# Patient Record
Sex: Male | Born: 2000 | Race: White | Hispanic: No | Marital: Single | Smoking: Never smoker
Health system: Southern US, Community
[De-identification: ages and names within clinical notes are randomized; demographics above are authoritative.]

## PROBLEM LIST (undated history)

## (undated) DIAGNOSIS — L709 Acne, unspecified: Secondary | ICD-10-CM

## (undated) DIAGNOSIS — F909 Attention-deficit hyperactivity disorder, unspecified type: Secondary | ICD-10-CM

## (undated) DIAGNOSIS — F32A Depression, unspecified: Secondary | ICD-10-CM

## (undated) DIAGNOSIS — F329 Major depressive disorder, single episode, unspecified: Secondary | ICD-10-CM

## (undated) DIAGNOSIS — F419 Anxiety disorder, unspecified: Secondary | ICD-10-CM

## (undated) HISTORY — DX: Depression, unspecified: F32.A

## (undated) HISTORY — DX: Anxiety disorder, unspecified: F41.9

## (undated) HISTORY — DX: Acne, unspecified: L70.9

## (undated) HISTORY — DX: Attention-deficit hyperactivity disorder, unspecified type: F90.9

## (undated) HISTORY — DX: Major depressive disorder, single episode, unspecified: F32.9

---
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
## 2019-05-11 ENCOUNTER — Emergency Department (HOSPITAL_COMMUNITY): Payer: Medicaid Other

## 2019-05-11 ENCOUNTER — Other Ambulatory Visit: Payer: Self-pay

## 2019-05-11 ENCOUNTER — Encounter (HOSPITAL_COMMUNITY): Payer: Self-pay | Admitting: Emergency Medicine

## 2019-05-11 ENCOUNTER — Inpatient Hospital Stay (HOSPITAL_COMMUNITY)
Admission: EM | Admit: 2019-05-11 | Discharge: 2019-05-13 | DRG: 872 | Disposition: A | Discharge status: Home / Self Care | Payer: Medicaid Other | Attending: Internal Medicine | Admitting: Internal Medicine

## 2019-05-11 DIAGNOSIS — R112 Nausea with vomiting, unspecified: Secondary | ICD-10-CM

## 2019-05-11 DIAGNOSIS — R197 Diarrhea, unspecified: Secondary | ICD-10-CM

## 2019-05-11 DIAGNOSIS — F329 Major depressive disorder, single episode, unspecified: Secondary | ICD-10-CM | POA: Diagnosis present

## 2019-05-11 DIAGNOSIS — Z8249 Family history of ischemic heart disease and other diseases of the circulatory system: Secondary | ICD-10-CM

## 2019-05-11 DIAGNOSIS — E860 Dehydration: Secondary | ICD-10-CM | POA: Diagnosis not present

## 2019-05-11 DIAGNOSIS — Z8349 Family history of other endocrine, nutritional and metabolic diseases: Secondary | ICD-10-CM

## 2019-05-11 DIAGNOSIS — Z20822 Contact with and (suspected) exposure to covid-19: Secondary | ICD-10-CM | POA: Diagnosis present

## 2019-05-11 DIAGNOSIS — R000 Tachycardia, unspecified: Secondary | ICD-10-CM | POA: Diagnosis not present

## 2019-05-11 DIAGNOSIS — Z9114 Patient's other noncompliance with medication regimen: Secondary | ICD-10-CM | POA: Diagnosis not present

## 2019-05-11 DIAGNOSIS — R1110 Vomiting, unspecified: Secondary | ICD-10-CM | POA: Diagnosis not present

## 2019-05-11 DIAGNOSIS — A0811 Acute gastroenteropathy due to Norwalk agent: Secondary | ICD-10-CM | POA: Diagnosis present

## 2019-05-11 DIAGNOSIS — R509 Fever, unspecified: Secondary | ICD-10-CM | POA: Diagnosis not present

## 2019-05-11 DIAGNOSIS — F411 Generalized anxiety disorder: Secondary | ICD-10-CM | POA: Diagnosis present

## 2019-05-11 DIAGNOSIS — F959 Tic disorder, unspecified: Secondary | ICD-10-CM | POA: Diagnosis present

## 2019-05-11 DIAGNOSIS — E876 Hypokalemia: Secondary | ICD-10-CM | POA: Diagnosis present

## 2019-05-11 DIAGNOSIS — R6510 Systemic inflammatory response syndrome (SIRS) of non-infectious origin without acute organ dysfunction: Secondary | ICD-10-CM | POA: Diagnosis not present

## 2019-05-11 DIAGNOSIS — Z6841 Body Mass Index (BMI) 40.0 and over, adult: Secondary | ICD-10-CM

## 2019-05-11 DIAGNOSIS — Z833 Family history of diabetes mellitus: Secondary | ICD-10-CM | POA: Diagnosis not present

## 2019-05-11 DIAGNOSIS — I10 Essential (primary) hypertension: Secondary | ICD-10-CM | POA: Diagnosis present

## 2019-05-11 DIAGNOSIS — F909 Attention-deficit hyperactivity disorder, unspecified type: Secondary | ICD-10-CM | POA: Diagnosis present

## 2019-05-11 DIAGNOSIS — Z7722 Contact with and (suspected) exposure to environmental tobacco smoke (acute) (chronic): Secondary | ICD-10-CM | POA: Diagnosis present

## 2019-05-11 DIAGNOSIS — A419 Sepsis, unspecified organism: Principal | ICD-10-CM | POA: Diagnosis present

## 2019-05-11 LAB — CBC WITH DIFFERENTIAL/PLATELET
Abs Immature Granulocytes: 0.04 10*3/uL (ref 0.00–0.07)
Basophils Absolute: 0 10*3/uL (ref 0.0–0.1)
Basophils Relative: 0 %
Eosinophils Absolute: 0 10*3/uL (ref 0.0–0.5)
Eosinophils Relative: 0 %
HCT: 47.7 % (ref 39.0–52.0)
Hemoglobin: 15.7 g/dL (ref 13.0–17.0)
Immature Granulocytes: 0 %
Lymphocytes Relative: 6 %
Lymphs Abs: 0.6 10*3/uL — ABNORMAL LOW (ref 0.7–4.0)
MCH: 29.5 pg (ref 26.0–34.0)
MCHC: 32.9 g/dL (ref 30.0–36.0)
MCV: 89.5 fL (ref 80.0–100.0)
Monocytes Absolute: 0.5 10*3/uL (ref 0.1–1.0)
Monocytes Relative: 5 %
Neutro Abs: 9.4 10*3/uL — ABNORMAL HIGH (ref 1.7–7.7)
Neutrophils Relative %: 89 %
Platelets: 220 10*3/uL (ref 150–400)
RBC: 5.33 MIL/uL (ref 4.22–5.81)
RDW: 13.3 % (ref 11.5–15.5)
WBC: 10.6 10*3/uL — ABNORMAL HIGH (ref 4.0–10.5)
nRBC: 0 % (ref 0.0–0.2)

## 2019-05-11 LAB — COMPREHENSIVE METABOLIC PANEL
ALT: 31 U/L (ref 0–44)
AST: 24 U/L (ref 15–41)
Albumin: 4.2 g/dL (ref 3.5–5.0)
Alkaline Phosphatase: 60 U/L (ref 38–126)
Anion gap: 12 (ref 5–15)
BUN: 14 mg/dL (ref 6–20)
CO2: 23 mmol/L (ref 22–32)
Calcium: 8.7 mg/dL — ABNORMAL LOW (ref 8.9–10.3)
Chloride: 100 mmol/L (ref 98–111)
Creatinine, Ser: 0.82 mg/dL (ref 0.61–1.24)
GFR calc Af Amer: >60 mL/min (ref >60)
GFR calc non Af Amer: >60 mL/min (ref >60)
Glucose, Bld: 111 mg/dL — ABNORMAL HIGH (ref 70–99)
Potassium: 3.2 mmol/L — ABNORMAL LOW (ref 3.5–5.1)
Sodium: 135 mmol/L (ref 135–145)
Total Bilirubin: 1.6 mg/dL — ABNORMAL HIGH (ref 0.3–1.2)
Total Protein: 7.6 g/dL (ref 6.5–8.1)

## 2019-05-11 LAB — LIPASE, BLOOD: Lipase: 19 U/L (ref 11–51)

## 2019-05-11 LAB — LACTIC ACID, PLASMA: Lactic Acid, Venous: 1.8 mmol/L (ref 0.5–1.9)

## 2019-05-11 LAB — POC SARS CORONAVIRUS 2 AG -  ED: SARS Coronavirus 2 Ag: NEGATIVE

## 2019-05-11 LAB — MAGNESIUM: Magnesium: 1.6 mg/dL — ABNORMAL LOW (ref 1.7–2.4)

## 2019-05-11 MED ORDER — METRONIDAZOLE IN NACL 5-0.79 MG/ML-% IV SOLN
500.0000 mg | Freq: Three times a day (TID) | INTRAVENOUS | Status: DC
  Administered 2019-05-12 – 2019-05-13 (×5): 500 mg via INTRAVENOUS
  Filled 2019-05-11 (×6): qty 100

## 2019-05-11 MED ORDER — ONDANSETRON HCL 4 MG/2ML IJ SOLN: 4.0000 mg | Freq: Four times a day (QID) | INTRAMUSCULAR | Status: DC | PRN

## 2019-05-11 MED ORDER — ENOXAPARIN SODIUM 40 MG/0.4ML ~~LOC~~ SOLN
40.0000 mg | Freq: Every day | SUBCUTANEOUS | Status: DC
  Administered 2019-05-11 – 2019-05-12 (×2): 40 mg via SUBCUTANEOUS
  Filled 2019-05-11 (×2): qty 0.4

## 2019-05-11 MED ORDER — ONDANSETRON HCL 4 MG/2ML IJ SOLN
4.0000 mg | Freq: Once | INTRAMUSCULAR | Status: AC
  Administered 2019-05-11: 4 mg via INTRAVENOUS
  Filled 2019-05-11: qty 2

## 2019-05-11 MED ORDER — METRONIDAZOLE IN NACL 5-0.79 MG/ML-% IV SOLN
500.0000 mg | Freq: Once | INTRAVENOUS | Status: AC
  Administered 2019-05-11: 500 mg via INTRAVENOUS
  Filled 2019-05-11: qty 100

## 2019-05-11 MED ORDER — IOHEXOL 300 MG/ML  SOLN
100.0000 mL | Freq: Once | INTRAMUSCULAR | Status: AC | PRN
  Administered 2019-05-11: 100 mL via INTRAVENOUS

## 2019-05-11 MED ORDER — SODIUM CHLORIDE 0.9 % IV SOLN: INTRAVENOUS | Status: DC

## 2019-05-11 MED ORDER — ACETAMINOPHEN 325 MG PO TABS
650.0000 mg | ORAL_TABLET | Freq: Once | ORAL | Status: AC
  Administered 2019-05-11: 650 mg via ORAL
  Filled 2019-05-11: qty 2

## 2019-05-11 MED ORDER — SODIUM CHLORIDE 0.9 % IV BOLUS
500.0000 mL | Freq: Once | INTRAVENOUS | Status: AC
  Administered 2019-05-11: 500 mL via INTRAVENOUS

## 2019-05-11 MED ORDER — ONDANSETRON HCL 4 MG PO TABS: 4.0000 mg | ORAL_TABLET | Freq: Four times a day (QID) | ORAL | Status: DC | PRN

## 2019-05-11 MED ORDER — ACETAMINOPHEN 325 MG PO TABS
650.0000 mg | ORAL_TABLET | Freq: Four times a day (QID) | ORAL | Status: DC | PRN
  Administered 2019-05-12: 650 mg via ORAL
  Filled 2019-05-11: qty 2

## 2019-05-11 MED ORDER — LACTATED RINGERS IV BOLUS (SEPSIS)
1000.0000 mL | Freq: Once | INTRAVENOUS | Status: AC
  Administered 2019-05-11: 1000 mL via INTRAVENOUS

## 2019-05-11 MED ORDER — SODIUM CHLORIDE 0.9 % IV SOLN
2.0000 g | Freq: Once | INTRAVENOUS | Status: AC
  Administered 2019-05-11: 2 g via INTRAVENOUS
  Filled 2019-05-11: qty 20

## 2019-05-11 MED ORDER — POTASSIUM CHLORIDE CRYS ER 20 MEQ PO TBCR
40.0000 meq | EXTENDED_RELEASE_TABLET | Freq: Once | ORAL | Status: AC
  Administered 2019-05-11: 40 meq via ORAL
  Filled 2019-05-11: qty 4

## 2019-05-11 MED ORDER — POTASSIUM CHLORIDE CRYS ER 20 MEQ PO TBCR
40.0000 meq | EXTENDED_RELEASE_TABLET | Freq: Once | ORAL | Status: AC
  Administered 2019-05-11: 40 meq via ORAL
  Filled 2019-05-11: qty 2

## 2019-05-11 MED ORDER — SODIUM CHLORIDE 0.9 % IV SOLN
2.0000 g | INTRAVENOUS | Status: DC
  Administered 2019-05-12: 2 g via INTRAVENOUS
  Filled 2019-05-11: qty 20

## 2019-05-11 MED ORDER — ACETAMINOPHEN 650 MG RE SUPP: 650.0000 mg | Freq: Four times a day (QID) | RECTAL | Status: DC | PRN

## 2019-05-11 NOTE — ED Triage Notes (Signed)
Ate at [employer] yesterday - chicken nuggets cheese burger fries  Vomiting since then   Vomited x 3 since this am   abd pain 7/10   Here for eval of "food poisoning"

## 2019-05-11 NOTE — ED Provider Notes (Signed)
[facility] EMERGENCY DEPARTMENT Provider Note   CSN: [id number] Arrival date & time: 05/11/19  1626     History Chief Complaint  Patient presents with  . Emesis    Henry [name] is a 19 y.o. male with past medical history of ADHD, depression, morbid obesity, motor tic disorder, generalized anxiety, who presents today for evaluation of multiple complaints. He reports that yesterday afternoon he ate [employer] and then had about 7 PM he developed abdominal pain, nausea, vomiting, and diarrhea.  Since this started he has had 7-8 episodes of vomiting and diarrhea each.  He thinks he may have had a fever at home however is unsure.  No known Covid contacts.  He reports that his sense of smell is still intact.  He reports that he feels lightheaded like he is going to pass out when he stands up.  He denies dysuria.  He denies any prior abdominal surgeries.  HPI     Past Medical History:  Diagnosis Date  . Acne   . ADHD (attention deficit hyperactivity disorder)   . Anxiety   . Depression     Patient Active Problem List   Diagnosis Date Noted  . Generalized anxiety disorder 08/26/2018  . Tremor of right hand 12/10/2017  . ADHD (attention deficit hyperactivity disorder) evaluation 12/10/2017  . Seizure-like activity (HCC) 11/26/2017  . Depression, recurrent (HCC) 03/21/2017  . 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 Morbid obesity (HCC) 03/22/2015  . Anxiety and depression 02/24/2015  . 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 No pertinent surgical history.     Family History  Problem Relation Age of Onset  . Thyroid disease Mother   . Diabetes Other   . Heart disease Other     Social History   Tobacco Use  . Smoking status: Passive Smoke Exposure - Never Smoker  . Smokeless tobacco: Never Used  Substance Use Topics  . Alcohol use: No    Alcohol/week: 0.0 standard drinks  . Drug use: No    Home Medications Prior to Admission medications   Medication Sig  Start Date End Date Taking? Authorizing Provider  lisdexamfetamine (VYVANSE) 60 MG capsule Take 1 capsule (60 mg total) by mouth daily. 08/05/18  Yes Dedlow, Ether Griffins, NP  sertraline (ZOLOFT) 50 MG tablet Take 1 tablet (50 mg total) by mouth 2 (two) times daily. 08/26/18  Yes [name], [name], NP    Allergies    Patient has no known allergies.  Review of Systems   Review of Systems  Constitutional: Positive for chills, fatigue and fever.  HENT: Negative for congestion.   Respiratory: Negative for chest tightness and shortness of breath.   Cardiovascular: Negative for chest pain.  Gastrointestinal: Positive for abdominal pain, diarrhea, nausea and vomiting. Negative for blood in stool.  Genitourinary: Negative for dysuria.  Musculoskeletal: Negative for back pain and myalgias.  Neurological: Positive for light-headedness (With position changes). Negative for weakness and headaches.  Psychiatric/Behavioral: Negative for confusion.  All other systems reviewed and are negative.   Physical Exam Updated Vital Signs BP 107/65   Pulse 98   Temp 100.3 F (37.9 C) (Oral)   Resp 15   Ht 5\' 6"  (1.676 m)   Wt (!) 141.5 kg   SpO2 99%   BMI 50.36 kg/m   Physical Exam Vitals and nursing note reviewed.  Constitutional:      Appearance: He is well-developed. He is obese. He is ill-appearing.  HENT:     Head: Normocephalic  and atraumatic.  Eyes:     Conjunctiva/sclera: Conjunctivae normal.  Cardiovascular:     Rate and Rhythm: Regular rhythm. Tachycardia present.     Heart sounds: Normal heart sounds. No murmur.  Pulmonary:     Effort: Pulmonary effort is normal. Tachypnea present. No respiratory distress.     Breath sounds: Normal breath sounds. No decreased breath sounds.  Abdominal:     Palpations: Abdomen is soft.     Tenderness: There is abdominal tenderness.     Comments: Exam limited by body habitus.  TTP in everywhere but LLQ, most tender in RLQ.   Musculoskeletal:      Cervical back: Normal range of motion and neck supple. No rigidity.     Right lower leg: No edema.     Left lower leg: No edema.  Skin:    General: Skin is warm and dry.  Neurological:     General: No focal deficit present.     Mental Status: He is alert and oriented to person, place, and time.     ED Results / Procedures / Treatments   Labs (all labs ordered are listed, but only abnormal results are displayed) Labs Reviewed  COMPREHENSIVE METABOLIC PANEL - Abnormal; Notable for the following components:      Result Value   Potassium 3.2 (*)    Glucose, Bld 111 (*)    Calcium 8.7 (*)    Total Bilirubin 1.6 (*)    All other components within normal limits  CBC WITH DIFFERENTIAL/PLATELET - Abnormal; Notable for the following components:   WBC 10.6 (*)    Neutro Abs 9.4 (*)    Lymphs Abs 0.6 (*)    All other components within normal limits  CULTURE, BLOOD (ROUTINE X 2)  CULTURE, BLOOD (ROUTINE X 2)  URINE CULTURE  GASTROINTESTINAL PANEL BY PCR, STOOL (REPLACES STOOL CULTURE)  C DIFFICILE QUICK SCREEN W PCR REFLEX  LIPASE, BLOOD  LACTIC ACID, PLASMA  LACTIC ACID, PLASMA  URINALYSIS, ROUTINE W REFLEX MICROSCOPIC  POC SARS CORONAVIRUS 2 AG -  ED    EKG EKG Interpretation  Date/Time:  Sunday May 11 2019 17:27:03 EDT Ventricular Rate:  110 PR Interval:    QRS Duration: 89 QT Interval:  291 QTC Calculation: 394 R Axis:   136 Text Interpretation: Sinus tachycardia Right axis deviation Borderline T abnormalities, inferior leads No STEMI Confirmed by Alvester Chou [phone]) on 05/11/2019 5:45:05 PM   Radiology CT ABDOMEN PELVIS W CONTRAST  Result Date: 05/11/2019 CLINICAL DATA:  Mid abdominal pain, vomiting EXAM: CT ABDOMEN AND PELVIS WITH CONTRAST TECHNIQUE: Multidetector CT imaging of the abdomen and pelvis was performed using the standard protocol following bolus administration of intravenous contrast. CONTRAST:  OMNIPAQUE IOHEXOL 300 MG/ML  SOLN COMPARISON:   06/14/2017 FINDINGS: Lower chest: Lung bases are clear. No effusions. Heart is normal size. Hepatobiliary: No focal hepatic abnormality. Gallbladder unremarkable. Pancreas: No focal abnormality or ductal dilatation. Spleen: No focal abnormality.  Normal size. Adrenals/Urinary Tract: No adrenal abnormality. No focal renal abnormality. No stones or hydronephrosis. Urinary bladder is unremarkable. Stomach/Bowel: Normal appendix. Stomach, large and small bowel grossly unremarkable. Vascular/Lymphatic: No evidence of aneurysm or adenopathy. Reproductive: No visible focal abnormality. Other: Insert up Musculoskeletal: No acute bony abnormality. IMPRESSION: Normal appendix. No acute findings in the abdomen or pelvis. Electronically Signed   By: Charlett Nose M.D.   On: 05/11/2019 19:15   DG Chest Port 1 View  Result Date: 05/11/2019 CLINICAL DATA:  fever, tachycardia, N/V/D, abd pain  EXAM: PORTABLE CHEST 1 VIEW COMPARISON:  06/14/2017 FINDINGS: The cardiomediastinal contours are normal. The lungs are clear. Pulmonary vasculature is normal. No consolidation, pleural effusion, or pneumothorax. No acute osseous abnormalities are seen. IMPRESSION: Negative AP view of the chest. Electronically Signed   By: Narda Rutherford M.D.   On: 05/11/2019 17:25    Procedures .Critical Care Performed by: Cristina Gong, PA-C Authorized by: Cristina Gong, PA-C   Critical care provider statement:    Critical care time (minutes):  45   Critical care was time spent personally by me on the following activities:  Discussions with consultants, evaluation of patient's response to treatment, examination of patient, ordering and performing treatments and interventions, ordering and review of laboratory studies, ordering and review of radiographic studies, pulse oximetry, re-evaluation of patient's condition, obtaining history from patient or surrogate and review of old charts   (including critical care  time)  Medications Ordered in ED Medications  lactated ringers bolus 1,000 mL (0 mLs Intravenous Stopped 05/11/19 2004)  ondansetron (ZOFRAN) injection 4 mg (4 mg Intravenous Given 05/11/19 1723)  acetaminophen (TYLENOL) tablet 650 mg (650 mg Oral Given 05/11/19 1731)  cefTRIAXone (ROCEPHIN) 2 g in sodium chloride 0.9 % 100 mL IVPB (0 g Intravenous Stopped 05/11/19 1845)  metroNIDAZOLE (FLAGYL) IVPB 500 mg ( Intravenous Stopped 05/11/19 1947)  lactated ringers bolus 1,000 mL (0 mLs Intravenous Stopped 05/11/19 2005)  iohexol (OMNIPAQUE) 300 MG/ML solution 100 mL (100 mLs Intravenous Contrast Given 05/11/19 1853)    ED Course  I have reviewed the triage vital signs and the nursing notes.  Pertinent labs & imaging results that were available during my care of the patient were reviewed by me and considered in my medical decision making (see chart for details).  Clinical Course as of May 11 2022  Sun May 11, 2019  [id number] 19 year old male with a history of depression and obesity presented to emergency department with nausea vomiting and diarrhea.  Reports yesterday in the evening he ate [employer] around 3 AM.  Approximately 4 hours later began having abrupt onset of nausea with vomiting and diarrhea.  He has been vomiting multiple times a day, he estimates about 5 times.  While he was bent over retching he also began having pain in his lower back, and thinks he may have pulled something there.  Denies it is been around anyone that he knows is Covid.  Does not have any respiratory symptoms.  On exam the patient is breathing heavily.  He has an obese abdomen.  Difficult to obtain a reliable abdominal exam given his girth but there is some mild diffuse tenderness, without focal tenderness at McBurney's point for me.  He has a negative Murphy sign.  He is febrile here.  Differential includes viral gastroenteritis versus bacterial colitis/infection stool vs. Appendicitis vs other intraabdominal inflammation.   Pending rapid covid testing, IV fluids, labs.  If rapid viral panel is negative we'll proceed to CT scan and initiate IV antibiotics.  He does not have a rigid or acute surgical abdomen at this time.   [MT]  1802 Covid test negative and the patient had a transient drop in his blood pressure.  We broadened to a full sepsis work-up and have ordered fluids and antibiotics.   [MT]    Clinical Course User Index [MT] Trifan, Kermit Balo, MD   MDM Rules/Calculators/A&P                     Patient is  a 19 year old man who presents today for evaluation of nausea, vomiting, diarrhea and abdominal pain.  Here he is found to be febrile, tachycardic, and tachypneic.  Based on the abrupt onset of his symptoms rapid Covid test antigen was obtained which was negative.  At that point code sepsis was called, and he is started on intra-abdominal sepsis antibiotics.  Blood cultures were obtained.  Lactic acid is not significantly elevated.  He does have a leukocytosis at 10.6.  CMP with mild elevation of total bilirubin at 1.6 and hypokalemia at 3.2.  Lipase is not elevated.  While in the emergency room patient's blood pressure did dip down into the 39Q systolic, however this improved with continued fluid administration.  He was given an adjusted 30/kg given his elevated BMI.  CT abdomen pelvis was obtained without evidence of appendicitis or other acute findings.  Given patient's fever, tachycardia and sirs criteria along with borderline hypotension will admit patient. I spoke with Dr. Arlyce Dice who will see patient.   Note: Portions of this report may have been transcribed using voice recognition software. Every effort was made to ensure accuracy; however, inadvertent computerized transcription errors may be present  Final Clinical Impression(s) / ED Diagnoses Final diagnoses:  Dehydration  Nausea vomiting and diarrhea  SIRS (systemic inflammatory response syndrome) [facility])    Rx / DC Orders ED Discharge  Orders    None       Lorin Glass, PA-C 05/12/19 0015    Wyvonnia Dusky, MD 05/12/19 [phone]

## 2019-05-11 NOTE — H&P (Signed)
History and Physical    Henry [name] VVO:[id number] DOB: [birth year] DOA: 05/11/2019  PCP: [name], Elige Radon, MD   Patient coming from: Home  I have personally briefly reviewed patient's old medical records in [facility]  Chief Complaint: Vomiting, diarrhea  HPI: Henry [name] is a 19 y.o. male with medical history significant for anxiety and depression, seizure activity, motor-tic disorder. Patient presented to the ED with complaints of vomiting and abdominal pain with diarrhea started yesterday evening about 3 hours after he ate chicken nuggets, cheeseburger and fries from [employer] yesterday. He has had up to 8 episodes of vomiting and similar episodes of loose stools since onset. Denies difficulty breathing, no cough, no pain with urination.  Denies illicit drug use, tobacco and alcohol use.  ED Course: Temperature 102.2, tachypneic, tachycardic to 143, systolic 94-124.  Potassium 3.2, sodium 135, WBC 10.6.  Normal lipase 19.  Unremarkable liver enzymes.  Blood and urine cultures obtained.  Point-of-care Covid test negative.  Chest x-ray and abdominal CT, both without acute findings. Started on IV ceftriaxone and metronidazole in the ED.  Hospitalist admit for further evaluation and management.  Review of Systems: As per HPI all other systems reviewed and negative.  Past Medical History:  Diagnosis Date  . Acne   . ADHD (attention deficit hyperactivity disorder)   . Anxiety   . Depression     History reviewed. No pertinent surgical history.   reports that he is a non-smoker but has been exposed to tobacco smoke. He has never used smokeless tobacco. He reports that he does not drink alcohol or use drugs.  No Known Allergies  Family History  Problem Relation Age of Onset  . Thyroid disease Mother   . Diabetes Other   . Heart disease Other     Prior to Admission medications   Medication Sig Start Date End Date Taking? Authorizing Provider  lisdexamfetamine  (VYVANSE) 60 MG capsule Take 1 capsule (60 mg total) by mouth daily. 08/05/18  Yes Dedlow, Ether Griffins, NP  sertraline (ZOLOFT) 50 MG tablet Take 1 tablet (50 mg total) by mouth 2 (two) times daily. 08/26/18  Yes Paretta-LeaheyMiachel Roux, NP    Physical Exam: Vitals:   05/11/19 1859 05/11/19 1900 05/11/19 1908 05/11/19 1930  BP:  (!) 98/39 107/65 107/65  Pulse:  (!) 105 (!) 109 98  Resp:  (!) 34 19 15  Temp: 100.3 F (37.9 C)     TempSrc: Oral     SpO2:  98% 99% 99%  Weight:      Height:        Constitutional: NAD, calm, comfortable Vitals:   05/11/19 1859 05/11/19 1900 05/11/19 1908 05/11/19 1930  BP:  (!) 98/39 107/65 107/65  Pulse:  (!) 105 (!) 109 98  Resp:  (!) 34 19 15  Temp: 100.3 F (37.9 C)     TempSrc: Oral     SpO2:  98% 99% 99%  Weight:      Height:       Eyes: PERRL, lids and conjunctivae normal ENMT: Mucous membranes are moist.  Neck: normal, supple, no masses, no thyromegaly Respiratory:  Normal respiratory effort. No accessory muscle use.  Cardiovascular: Regular rate and rhythm, no murmurs / rubs / gallops. No extremity edema. 2+ pedal pulses.   Abdomen: Minimal tenderness left upper abdomen, no masses palpated. No hepatosplenomegaly. Bowel sounds positive.  Musculoskeletal: no clubbing / cyanosis. No joint deformity upper and lower extremities. Good ROM, no contractures.  Skin: no  rashes, lesions, ulcers. No induration Neurologic: No apparent cranial nerve abnormality, moving all extremities spontaneously Psychiatric: Normal judgment and insight. Alert and oriented x 3. Normal mood.   Labs on Admission: I have personally reviewed following labs and imaging studies  CBC: Recent Labs  Lab 05/11/19 1714  WBC 10.6*  NEUTROABS 9.4*  HGB 15.7  HCT 47.7  MCV 89.5  PLT 160   Basic Metabolic Panel: Recent Labs  Lab 05/11/19 1714  NA 135  K 3.2*  CL 100  CO2 23  GLUCOSE 111*  BUN 14  CREATININE 0.82  CALCIUM 8.7*   Liver Function Tests: Recent Labs    Lab 05/11/19 1714  AST 24  ALT 31  ALKPHOS 60  BILITOT 1.6*  PROT 7.6  ALBUMIN 4.2   Recent Labs  Lab 05/11/19 1714  LIPASE 19    Radiological Exams on Admission: CT ABDOMEN PELVIS W CONTRAST  Result Date: 05/11/2019 CLINICAL DATA:  Mid abdominal pain, vomiting EXAM: CT ABDOMEN AND PELVIS WITH CONTRAST TECHNIQUE: Multidetector CT imaging of the abdomen and pelvis was performed using the standard protocol following bolus administration of intravenous contrast. CONTRAST:  147mL OMNIPAQUE IOHEXOL 300 MG/ML  SOLN COMPARISON:  06/14/2017 FINDINGS: Lower chest: Lung bases are clear. No effusions. Heart is normal size. Hepatobiliary: No focal hepatic abnormality. Gallbladder unremarkable. Pancreas: No focal abnormality or ductal dilatation. Spleen: No focal abnormality.  Normal size. Adrenals/Urinary Tract: No adrenal abnormality. No focal renal abnormality. No stones or hydronephrosis. Urinary bladder is unremarkable. Stomach/Bowel: Normal appendix. Stomach, large and small bowel grossly unremarkable. Vascular/Lymphatic: No evidence of aneurysm or adenopathy. Reproductive: No visible focal abnormality. Other: Insert up Musculoskeletal: No acute bony abnormality. IMPRESSION: Normal appendix. No acute findings in the abdomen or pelvis. Electronically Signed   By: Rolm Baptise M.D.   On: 05/11/2019 19:15   DG Chest Port 1 View  Result Date: 05/11/2019 CLINICAL DATA:  fever, tachycardia, N/V/D, abd pain EXAM: PORTABLE CHEST 1 VIEW COMPARISON:  06/14/2017 FINDINGS: The cardiomediastinal contours are normal. The lungs are clear. Pulmonary vasculature is normal. No consolidation, pleural effusion, or pneumothorax. No acute osseous abnormalities are seen. IMPRESSION: Negative AP view of the chest. Electronically Signed   By: Keith Rake M.D.   On: 05/11/2019 17:25    EKG: Independently reviewed.. Sinus tachycardia rate 110, QTc 394, nonspecific T wave abnormalities in leads II, III, aVF.  No old  EKG to compare.  Assessment/Plan Active Problems:   SIRS (systemic inflammatory response syndrome) (HCC)  Gastroenteritis- vomiting, diarrhea, abdominal pain, onset 3 hours after meal at [employer]- ?Food poisoning versus if incidental. ? viral versus bacterial etiology. ?  Abdominal CT without acute abnormality.  Liver enzymes, lipase all unremarkable. -Follow-up UA and urine cultures -Will continue IV ceftriaxone and metronidazole for now considering SIRS. -C. difficile and GI panel. -Bowel rest with clear liquid diet -Zofran as needed - CMP, CBC a.m  SIRS-febrile to 102.2, tachycardic, tachypneic.  WBC 10.6, with normal lactic acid 1.8. Chest and abdominal/pelvic CT W contrast without acute abnormality.  Point-of-care Covid test negative.  Likely gastrointestinal source. -Follow-up blood and urine cultures -2 L bolus Ringer's lactate given, continue N/s 125cc/hr x 1 day -Obtain respiratory virus panel. -Continue antibiotics to cover gastrointestinal pathogens. - UDS  Hypokalemia-3.2.  Likely due to to GI losses. -Replete K -Check magnesium  Anxiety, depression, ADHD -He has not been taking his medications Vyvanse, sertraline in at least a month, hence not resumed inpatient. -Reports history of uncontrollable muscle spasms, but no  actual history of seizures, never been on medications for seizures.  Morbid obesity-BMI of 50.  DVT prophylaxis: Lovenox Code Status: Full code Family Communication: None at bedside. Disposition Plan: Pending improvement in SIRS physiology and pending results of blood and urine cultures. Consults called: none Admission status: Inpatient, telemetry I certify that at the point of admission it is my clinical judgment that the patient will require inpatient hospital care spanning beyond 2 midnights from the point of admission due to high intensity of service, high risk for further deterioration and high frequency of surveillance required. The following  factors support the patient status of inpatient: Gastrointestinal symptoms with ongoing GI losses meeting SIRS criteria, with borderline low blood pressure, acquiring IV hydration and close monitoring pending cultures.   Onnie Boer MD [facility]  05/11/2019, 9:14 PM

## 2019-05-11 NOTE — ED Notes (Signed)
Pt reports that he has not taken his meds " in awhile"  Education: He is advised that psych meds have to be maintained at a therapeutic level  He reports that the meds make him sleepy and that is why he does not take them   He has an appt with his doctor "in [place]" on the 26th   He is advised to call Monday and speak with office staff regarding his medication and see if it should be adjusted

## 2019-05-11 NOTE — ED Triage Notes (Signed)
Pt also report D  Reports everyone in his home has had covid vaccinations save him

## 2019-05-12 DIAGNOSIS — E860 Dehydration: Secondary | ICD-10-CM

## 2019-05-12 DIAGNOSIS — R197 Diarrhea, unspecified: Secondary | ICD-10-CM

## 2019-05-12 DIAGNOSIS — A419 Sepsis, unspecified organism: Principal | ICD-10-CM

## 2019-05-12 DIAGNOSIS — R112 Nausea with vomiting, unspecified: Secondary | ICD-10-CM

## 2019-05-12 DIAGNOSIS — Z6841 Body Mass Index (BMI) 40.0 and over, adult: Secondary | ICD-10-CM

## 2019-05-12 LAB — RESPIRATORY PANEL BY PCR

## 2019-05-12 LAB — URINALYSIS, ROUTINE W REFLEX MICROSCOPIC
Bilirubin Urine: NEGATIVE
Glucose, UA: NEGATIVE mg/dL
Hgb urine dipstick: NEGATIVE
Ketones, ur: NEGATIVE mg/dL
Leukocytes,Ua: NEGATIVE
Nitrite: NEGATIVE
Protein, ur: NEGATIVE mg/dL
Specific Gravity, Urine: 1.032 — ABNORMAL HIGH (ref 1.005–1.030)
pH: 6 (ref 5.0–8.0)

## 2019-05-12 LAB — COMPREHENSIVE METABOLIC PANEL
ALT: 24 U/L (ref 0–44)
AST: 18 U/L (ref 15–41)
Albumin: 3.2 g/dL — ABNORMAL LOW (ref 3.5–5.0)
Alkaline Phosphatase: 44 U/L (ref 38–126)
Anion gap: 8 (ref 5–15)
BUN: 9 mg/dL (ref 6–20)
CO2: 23 mmol/L (ref 22–32)
Calcium: 8.1 mg/dL — ABNORMAL LOW (ref 8.9–10.3)
Chloride: 107 mmol/L (ref 98–111)
Creatinine, Ser: 0.66 mg/dL (ref 0.61–1.24)
GFR calc Af Amer: >60 mL/min (ref >60)
GFR calc non Af Amer: >60 mL/min (ref >60)
Glucose, Bld: 99 mg/dL (ref 70–99)
Potassium: 3.5 mmol/L (ref 3.5–5.1)
Sodium: 138 mmol/L (ref 135–145)
Total Bilirubin: 1.1 mg/dL (ref 0.3–1.2)
Total Protein: 5.7 g/dL — ABNORMAL LOW (ref 6.5–8.1)

## 2019-05-12 LAB — CBC
HCT: 39.3 % (ref 39.0–52.0)
Hemoglobin: 12.7 g/dL — ABNORMAL LOW (ref 13.0–17.0)
MCH: 29.3 pg (ref 26.0–34.0)
MCHC: 32.3 g/dL (ref 30.0–36.0)
MCV: 90.6 fL (ref 80.0–100.0)
Platelets: 169 10*3/uL (ref 150–400)
RBC: 4.34 MIL/uL (ref 4.22–5.81)
RDW: 13.4 % (ref 11.5–15.5)
WBC: 4.9 10*3/uL (ref 4.0–10.5)
nRBC: 0 % (ref 0.0–0.2)

## 2019-05-12 LAB — RAPID URINE DRUG SCREEN, HOSP PERFORMED
Amphetamines: NOT DETECTED
Barbiturates: NOT DETECTED
Benzodiazepines: NOT DETECTED
Cocaine: NOT DETECTED
Opiates: NOT DETECTED
Tetrahydrocannabinol: NOT DETECTED

## 2019-05-12 LAB — C DIFFICILE QUICK SCREEN W PCR REFLEX
C Diff antigen: NEGATIVE
C Diff interpretation: NOT DETECTED
C Diff toxin: NEGATIVE

## 2019-05-12 LAB — HIV ANTIBODY (ROUTINE TESTING W REFLEX): HIV Screen 4th Generation wRfx: NONREACTIVE

## 2019-05-12 MED ORDER — SODIUM CHLORIDE 0.9 % IV SOLN: INTRAVENOUS | Status: AC

## 2019-05-12 MED ORDER — MAGNESIUM SULFATE 2 GM/50ML IV SOLN
2.0000 g | Freq: Once | INTRAVENOUS | Status: AC
  Administered 2019-05-12: 2 g via INTRAVENOUS
  Filled 2019-05-12: qty 50

## 2019-05-12 NOTE — Progress Notes (Signed)
PROGRESS NOTE    Henry [name]  MLY:[id number] DOB: [birth year] DOA: 05/11/2019 PCP: [name], [name], MD   Chief Complaint  Patient presents with  . Emesis    Brief Narrative:  As per H&P written by Dr. [name] on 05/11/2019 19 y.o. male with medical history significant for anxiety and depression, seizure activity, motor-tic disorder. Patient presented to the ED with complaints of vomiting and abdominal pain with diarrhea started yesterday evening about 3 hours after he ate chicken nuggets, cheeseburger and fries from [employer] yesterday. He has had up to 8 episodes of vomiting and similar episodes of loose stools since onset. Denies difficulty breathing, no cough, no pain with urination.  Denies illicit drug use, tobacco and alcohol use.  ED Course: Temperature 102.2, tachypneic, tachycardic to 812, systolic 75-170.  Potassium 3.2, sodium 135, WBC 10.6.  Normal lipase 19.  Unremarkable liver enzymes.  Blood and urine cultures obtained.  Point-of-care Covid test negative.  Chest x-ray and abdominal CT, both without acute findings. Started on IV ceftriaxone and metronidazole in the ED.  Hospitalist admit for further evaluation and management.  Assessment & Plan: 1-sepsis: In the setting of gastroenteritis present on admission -Patient met sepsis criteria with elevated temperature, tachycardia, tachypnea, elevated WBCs and gastroenteritis as presumed source of infection. -Fluid resuscitation and antibiotic has been given -Continue to follow blood cultures and a stool GI pathogen panel. -C. difficile negative. -Continue supportive care and advancing diet.  2-gastroenteritis -As mentioned above started after eating some fast food at [employer] -Patient presents with vomiting, diarrhea and abdominal pain.  He was found to be febrile and with elevated WBCs. -Continue fluid resuscitation, supportive care and start advancing diet. -Empirically Rocephin and metronidazole has been  ordered. -No further nausea vomiting at this time; reports still having loose stools.  3-hypokalemia and hypomagnesemia -In the setting of GI losses -Electrolytes repleted -Will follow trend.  4-anxiety, depression/HTN HD -Patient reports not taking his medications for over a month prior to admission. -At this moment we will continue holding medications and ask patient to follow-up with PCP for psychiatry referral and further management. -Mood is a stable -No suicidal ideation or hallucinations.  5-morbid obesity -Body mass index is 51.88 kg/m. -Low calorie diet, portion control and increase physical activity discussed with patient.    DVT prophylaxis: Lovenox Code Status: Full code. Family Communication: No family at bedside. Disposition:   Status is: Inpatient  Dispo: The patient is from: Home              Anticipated d/c is to: Home              Anticipated d/c date is: 05/13/19              Patient currently is medically improved; tolerating clear liquid diet and currently afebrile.  Still having loose stools but expressed no nausea or vomiting.  Will advance diet, continue antibiotics for now and follow GI panel.     Consultants:   None  Procedures:  See below for x-ray reports.  Antimicrobials:  Rocephin and Flagyl  Subjective: Currently afebrile and expressing no nausea or vomiting.  Still having loose stools reports tolerating clear liquid diet.  C. difficile test by PCR negative.  Objective: Vitals:   05/11/19 2213 05/12/19 0215 05/12/19 0623 05/12/19 1031  BP: (!) 94/54 110/64 (!) 94/51 111/63  Pulse: 98 86 71 66  Resp: 14 15 18 16   Temp: 100.1 F (37.8 C) 99 F (37.2 C) 98.8 F (37.1 C)  98.7 F (37.1 C)  TempSrc: Oral Oral  Oral  SpO2: 96% 100% 98% 99%  Weight:      Height:        Intake/Output Summary (Last 24 hours) at 05/12/2019 1251 Last data filed at 05/12/2019 0900 Gross per 24 hour  Intake 3589.77 ml  Output 650 ml  Net 2939.77 ml    Filed Weights   05/11/19 1636 05/11/19 2211  Weight: (!) 141.5 kg (!) 145.8 kg    Examination: General exam: Appears calm and comfortable; currently afebrile, no nausea or vomiting at this time.  Tolerating clear liquid diet.  Denies chest pain or shortness of breath. Respiratory system: Clear to auscultation. Respiratory effort normal. Cardiovascular system: S1 & S2 heard, RRR. No JVD, murmurs, rubs, gallops or clicks. No pedal edema. Gastrointestinal system: Abdomen is nondistended, soft and currently nontender. No organomegaly or masses felt. Normal bowel sounds heard. Central nervous system: Alert and oriented. No focal neurological deficits. Extremities: Symmetric 5 x 5 power.  No focal deficits. Skin: No rashes, no petechiae, no open wounds. Psychiatry: Judgement and insight appear normal. Mood & affect appropriate.     Data Reviewed: I have personally reviewed following labs and imaging studies  CBC: Recent Labs  Lab 05/11/19 1714 05/12/19 0547  WBC 10.6* 4.9  NEUTROABS 9.4*  --   HGB 15.7 12.7*  HCT 47.7 39.3  MCV 89.5 90.6  PLT 220 438    Basic Metabolic Panel: Recent Labs  Lab 05/11/19 1714 05/11/19 2002 05/12/19 0547  NA 135  --  138  K 3.2*  --  3.5  CL 100  --  107  CO2 23  --  23  GLUCOSE 111*  --  99  BUN 14  --  9  CREATININE 0.82  --  0.66  CALCIUM 8.7*  --  8.1*  MG  --  1.6*  --     GFR: Estimated Creatinine Clearance: 201.3 mL/min (by C-G formula based on SCr of 0.66 mg/dL).  Liver Function Tests: Recent Labs  Lab 05/11/19 1714 05/12/19 0547  AST 24 18  ALT 31 24  ALKPHOS 60 44  BILITOT 1.6* 1.1  PROT 7.6 5.7*  ALBUMIN 4.2 3.2*    CBG: No results for input(s): GLUCAP in the last 168 hours.   Recent Results (from the past 240 hour(s))  Blood Culture (routine x 2)     Status: None (Preliminary result)   Collection Time: 05/11/19  5:17 PM   Specimen: Right Antecubital; Blood  Result Value Ref Range Status   Specimen  Description RIGHT ANTECUBITAL  Final   Special Requests   Final    BOTTLES DRAWN AEROBIC AND ANAEROBIC Blood Culture adequate volume   Culture   Final    NO GROWTH < 24 HOURS Performed at [facility], [address] [place], [place] [address]    Report Status PENDING  Incomplete  Blood Culture (routine x 2)     Status: None (Preliminary result)   Collection Time: 05/11/19  6:35 PM   Specimen: BLOOD LEFT ARM  Result Value Ref Range Status   Specimen Description BLOOD LEFT ARM  Final   Special Requests   Final    BOTTLES DRAWN AEROBIC AND ANAEROBIC Blood Culture adequate volume   Culture   Final    NO GROWTH < 24 HOURS Performed at [facility], [address] [place], [place] [address]    Report Status PENDING  Incomplete  Respiratory Panel by PCR  Status: None   Collection Time: 05/11/19  8:33 PM   Specimen: Nasopharyngeal Swab; Respiratory  Result Value Ref Range Status   Adenovirus NOT DETECTED NOT DETECTED Final   Coronavirus 229E NOT DETECTED NOT DETECTED Final    Comment: (NOTE) The Coronavirus on the Respiratory Panel, DOES NOT test for the novel  Coronavirus (2019 nCoV)    Coronavirus HKU1 NOT DETECTED NOT DETECTED Final   Coronavirus NL63 NOT DETECTED NOT DETECTED Final   Coronavirus OC43 NOT DETECTED NOT DETECTED Final   Metapneumovirus NOT DETECTED NOT DETECTED Final   Rhinovirus / Enterovirus NOT DETECTED NOT DETECTED Final   Influenza A NOT DETECTED NOT DETECTED Final   Influenza B NOT DETECTED NOT DETECTED Final   Parainfluenza Virus 1 NOT DETECTED NOT DETECTED Final   Parainfluenza Virus 2 NOT DETECTED NOT DETECTED Final   Parainfluenza Virus 3 NOT DETECTED NOT DETECTED Final   Parainfluenza Virus 4 NOT DETECTED NOT DETECTED Final   Respiratory Syncytial Virus NOT DETECTED NOT DETECTED Final   Bordetella pertussis NOT DETECTED NOT DETECTED Final   Chlamydophila pneumoniae NOT DETECTED NOT DETECTED Final   Mycoplasma pneumoniae NOT DETECTED NOT DETECTED  Final  C Difficile Quick Screen w PCR reflex     Status: None   Collection Time: 05/11/19 11:25 PM   Specimen: Stool  Result Value Ref Range Status   C Diff antigen NEGATIVE NEGATIVE Final   C Diff toxin NEGATIVE NEGATIVE Final   C Diff interpretation No C. difficile detected.  Final    Comment: Performed at [facility], [address] [place], [place] [address]     Radiology Studies: CT ABDOMEN PELVIS W CONTRAST  Result Date: 05/11/2019 CLINICAL DATA:  Mid abdominal pain, vomiting EXAM: CT ABDOMEN AND PELVIS WITH CONTRAST TECHNIQUE: Multidetector CT imaging of the abdomen and pelvis was performed using the standard protocol following bolus administration of intravenous contrast. CONTRAST:  164m OMNIPAQUE IOHEXOL 300 MG/ML  SOLN COMPARISON:  06/14/2017 FINDINGS: Lower chest: Lung bases are clear. No effusions. Heart is normal size. Hepatobiliary: No focal hepatic abnormality. Gallbladder unremarkable. Pancreas: No focal abnormality or ductal dilatation. Spleen: No focal abnormality.  Normal size. Adrenals/Urinary Tract: No adrenal abnormality. No focal renal abnormality. No stones or hydronephrosis. Urinary bladder is unremarkable. Stomach/Bowel: Normal appendix. Stomach, large and small bowel grossly unremarkable. Vascular/Lymphatic: No evidence of aneurysm or adenopathy. Reproductive: No visible focal abnormality. Other: Insert up Musculoskeletal: No acute bony abnormality. IMPRESSION: Normal appendix. No acute findings in the abdomen or pelvis. Electronically Signed   By: KRolm BaptiseM.D.   On: 05/11/2019 19:15   DG Chest Port 1 View  Result Date: 05/11/2019 CLINICAL DATA:  fever, tachycardia, N/V/D, abd pain EXAM: PORTABLE CHEST 1 VIEW COMPARISON:  06/14/2017 FINDINGS: The cardiomediastinal contours are normal. The lungs are clear. Pulmonary vasculature is normal. No consolidation, pleural effusion, or pneumothorax. No acute osseous abnormalities are seen. IMPRESSION: Negative AP view of the  chest. Electronically Signed   By: MKeith RakeM.D.   On: 05/11/2019 17:25    Scheduled Meds: . enoxaparin (LOVENOX) injection  40 mg Subcutaneous QHS   Continuous Infusions: . sodium chloride    . cefTRIAXone (ROCEPHIN)  IV    . magnesium sulfate bolus IVPB    . metronidazole 500 mg (05/12/19 1026)     LOS: 1 day    Time spent: 30 minutes    CBarton Dubois MD [facility]   To contact the attending provider between 7A-7P or the covering provider during  after hours 7P-7A, please log into the web site [email or web address] and access using universal [place] password for that web site. If you do not have the password, please call the hospital operator.  05/12/2019, 12:51 PM

## 2019-05-13 DIAGNOSIS — E860 Dehydration: Secondary | ICD-10-CM

## 2019-05-13 DIAGNOSIS — R112 Nausea with vomiting, unspecified: Secondary | ICD-10-CM

## 2019-05-13 DIAGNOSIS — R6510 Systemic inflammatory response syndrome (SIRS) of non-infectious origin without acute organ dysfunction: Secondary | ICD-10-CM

## 2019-05-13 DIAGNOSIS — R197 Diarrhea, unspecified: Secondary | ICD-10-CM

## 2019-05-13 LAB — BASIC METABOLIC PANEL
Anion gap: 9 (ref 5–15)
BUN: 7 mg/dL (ref 6–20)
CO2: 25 mmol/L (ref 22–32)
Calcium: 8.6 mg/dL — ABNORMAL LOW (ref 8.9–10.3)
Chloride: 108 mmol/L (ref 98–111)
Creatinine, Ser: 0.67 mg/dL (ref 0.61–1.24)
GFR calc Af Amer: >60 mL/min (ref >60)
GFR calc non Af Amer: >60 mL/min (ref >60)
Glucose, Bld: 89 mg/dL (ref 70–99)
Potassium: 3.6 mmol/L (ref 3.5–5.1)
Sodium: 142 mmol/L (ref 135–145)

## 2019-05-13 LAB — GASTROINTESTINAL PANEL BY PCR, STOOL (REPLACES STOOL CULTURE)

## 2019-05-13 LAB — MAGNESIUM: Magnesium: 2.3 mg/dL (ref 1.7–2.4)

## 2019-05-13 MED ORDER — ONDANSETRON 8 MG PO TBDP: 8.0000 mg | ORAL_TABLET | Freq: Three times a day (TID) | ORAL | 0 refills | Status: DC | PRN

## 2019-05-13 MED ORDER — FAMOTIDINE 20 MG PO TABS: 20.0000 mg | ORAL_TABLET | Freq: Every day | ORAL | 1 refills | Status: DC

## 2019-05-13 NOTE — Progress Notes (Signed)
Received call from lab that GI PCR was positive for norovirus. Notified Dr. Gwenlyn Perking. Stated okay and he had discussed that possibility with patient.

## 2019-05-13 NOTE — Discharge Summary (Signed)
Physician Discharge Summary  Henry [name] HEN:[id number] DOB: Feb 22, 2000 DOA: 05/11/2019  PCP: [name], [name], MD  Admit date: 05/11/2019 Discharge date: 05/13/2019  Time spent: 35 minutes  Recommendations for Outpatient Follow-up:  1. Repeat basic metabolic panel to evaluate lites and renal function 2. Continue assisting patient with weight loss management and if needed referral to bariatric clinic.   Discharge Diagnoses:  Active Problems:   Morbid obesity with body mass index of 50.0-59.9 in adult (HCC)   SIRS (systemic inflammatory response syndrome) (HCC)   Dehydration   Nausea vomiting and diarrhea   Discharge Condition: Stable and improved.  Discharged home with instruction to follow-up with PCP in 10 days.  CODE STATUS: Full code  Diet recommendation: Low calorie diet.  Filed Weights   05/11/19 1636 05/11/19 2211  Weight: (!) 141.5 kg (!) 145.8 kg    History of present illness:  As per H&P written by Dr. [name] on 05/11/2019 19 y.o.malewith medical history significant foranxiety and depression, seizure activity,motor-ticdisorder. Patient presented to the ED with complaints of vomiting and abdominal pain with diarrhea started yesterday eveningabout 3 hours after heatechicken nuggets, cheeseburger and fries from [employer] yesterday. He has had up to 8 episodes of vomiting and similar episodes of loose stools since onset. Denies difficulty breathing, no cough,nopain with urination.  Denies illicit drug use, tobacco and alcohol use.  ED Course:Temperature 102.2, tachypneic, tachycardic to 361, systolic 44-315. Potassium 3.2, sodium 135,WBC 10.6. Normal lipase 19. Unremarkable liver enzymes. Blood and urine cultures obtained. Point-of-care Covid test negative. Chest x-ray and abdominal CT, bothwithout acute findings. Started on IV ceftriaxone and metronidazole in the ED. Hospitalist admit for further evaluation and management.  Hospital Course:   1-sepsis: In the setting of gastroenteritis present on admission -Patient met sepsis criteria with elevated temperature, tachycardia, tachypnea, elevated WBCs and gastroenteritis as presumed source of infection. -Fluid resuscitation and antibiotics empirically given. -Will follow final results of GI pathogen panel -Given complete resolution of sepsis features and improvement in his condition presentation most likely associated with viral gastroenteritis.  No antibiotics will be provided at discharge. -C. difficile negative. -Patient advised to maintain adequate hydration.  2-gastroenteritis -As mentioned above started after eating some fast food at [employer] -Patient presents with vomiting, diarrhea and abdominal pain.  He was found to be febrile and with elevated WBCs. -Patient has been advised to keep himself well hydrated, and to use over-the-counter Tylenol as needed for pain; prescription for famotidine has been provided along with prescription for Zofran to use as needed. -No antibiotics prescribed at time of discharge. -C. difficile negative -GI pathogen panel pending; but given drastic improvement and resolution of patient's symptoms most likely viral etiology for his gastroenteritis.  3-hypokalemia and hypomagnesemia -In the setting of GI losses -Electrolytes repleted and within normal limits at discharge. -Recommend repeat basic metabolic panel to follow electrolytes renal follow-up visit.  4-anxiety, depression/HTN HD -Patient reports not taking his medications for over a month prior to admission. -At this moment will continue holding medications and ask patient to follow-up with PCP for psychiatry referral and further management. -Mood is a stable -No suicidal ideation or hallucinations.  5-morbid obesity -Body mass index is 51.88 kg/m. -Low calorie diet, portion control and increase physical activity discussed with patient.    Procedures:  See below for x-ray  reports.  Consultations:  None  Discharge Exam: Vitals:   05/12/19 2116 05/13/19 0522  BP: (!) 87/75 121/75  Pulse: 74 68  Resp: 20 20  Temp: 99.6  F (37.6 C) 98.8 F (37.1 C)  SpO2: 98% 100%    General exam: Appears calm and comfortable; currently afebrile. Feeling ready for discharge.  Tolerating regular consistency diet and in no distress.  Denies any further episodes of abdominal pain, nausea or vomiting. Respiratory system: Clear to auscultation. Respiratory effort normal. Cardiovascular system: S1 & S2 heard, RRR. No JVD, murmurs, rubs, gallops or clicks. No pedal edema. Gastrointestinal system: Abdomen is nondistended, soft and currently nontender. No organomegaly or masses felt. Normal bowel sounds heard. Central nervous system: Alert and oriented. No focal neurological deficits. Extremities: Symmetric 5 x 5 power.  No focal deficits. Skin: No rashes, no petechiae, no open wounds. Psychiatry: Judgement and insight appear normal. Mood & affect appropriate.   Discharge Instructions   Discharge Instructions    Discharge instructions   Complete by: As directed    Take medications as prescribed. Maintain adequate hydration. Follow low calorie diet. Follow-up with PCP in 10 days. Use tylenol OTC as needed for pain and PRN zofran to assist with nausea/vomiting if required.     Allergies as of 05/13/2019   No Known Allergies     Medication List    STOP taking these medications   lisdexamfetamine 60 MG capsule Commonly known as: Vyvanse   sertraline 50 MG tablet Commonly known as: Zoloft     TAKE these medications   famotidine 20 MG tablet Commonly known as: Pepcid Take 1 tablet (20 mg total) by mouth daily.   ondansetron 8 MG disintegrating tablet Commonly known as: Zofran ODT Take 1 tablet (8 mg total) by mouth every 8 (eight) hours as needed for nausea or vomiting.      No Known Allergies Follow-up Information    [name], [name], MD. Schedule an  appointment as soon as possible for a visit in 10 day(s).   Specialties: Family Medicine, Cardiology Contact information: [place] [address] [phone]           The results of significant diagnostics from this hospitalization (including imaging, microbiology, ancillary and laboratory) are listed below for reference.    Significant Diagnostic Studies: CT ABDOMEN PELVIS W CONTRAST  Result Date: 05/11/2019 CLINICAL DATA:  Mid abdominal pain, vomiting EXAM: CT ABDOMEN AND PELVIS WITH CONTRAST TECHNIQUE: Multidetector CT imaging of the abdomen and pelvis was performed using the standard protocol following bolus administration of intravenous contrast. CONTRAST:  166m OMNIPAQUE IOHEXOL 300 MG/ML  SOLN COMPARISON:  06/14/2017 FINDINGS: Lower chest: Lung bases are clear. No effusions. Heart is normal size. Hepatobiliary: No focal hepatic abnormality. Gallbladder unremarkable. Pancreas: No focal abnormality or ductal dilatation. Spleen: No focal abnormality.  Normal size. Adrenals/Urinary Tract: No adrenal abnormality. No focal renal abnormality. No stones or hydronephrosis. Urinary bladder is unremarkable. Stomach/Bowel: Normal appendix. Stomach, large and small bowel grossly unremarkable. Vascular/Lymphatic: No evidence of aneurysm or adenopathy. Reproductive: No visible focal abnormality. Other: Insert up Musculoskeletal: No acute bony abnormality. IMPRESSION: Normal appendix. No acute findings in the abdomen or pelvis. Electronically Signed   By: KRolm BaptiseM.D.   On: 05/11/2019 19:15   DG Chest Port 1 View  Result Date: 05/11/2019 CLINICAL DATA:  fever, tachycardia, N/V/D, abd pain EXAM: PORTABLE CHEST 1 VIEW COMPARISON:  06/14/2017 FINDINGS: The cardiomediastinal contours are normal. The lungs are clear. Pulmonary vasculature is normal. No consolidation, pleural effusion, or pneumothorax. No acute osseous abnormalities are seen. IMPRESSION: Negative AP view of the chest.  Electronically Signed   By: MAurther LoftD.  On: 05/11/2019 17:25    Microbiology: Recent Results (from the past 240 hour(s))  Blood Culture (routine x 2)     Status: None (Preliminary result)   Collection Time: 05/11/19  5:17 PM   Specimen: Right Antecubital; Blood  Result Value Ref Range Status   Specimen Description RIGHT ANTECUBITAL  Final   Special Requests   Final    BOTTLES DRAWN AEROBIC AND ANAEROBIC Blood Culture adequate volume   Culture   Final    NO GROWTH 2 DAYS Performed at [facility], [address], [place], [place] [address]    Report Status PENDING  Incomplete  Blood Culture (routine x 2)     Status: None (Preliminary result)   Collection Time: 05/11/19  6:35 PM   Specimen: BLOOD LEFT ARM  Result Value Ref Range Status   Specimen Description BLOOD LEFT ARM  Final   Special Requests   Final    BOTTLES DRAWN AEROBIC AND ANAEROBIC Blood Culture adequate volume   Culture   Final    NO GROWTH 2 DAYS Performed at [facility], [address] [place], [place] [address]    Report Status PENDING  Incomplete  Respiratory Panel by PCR     Status: None   Collection Time: 05/11/19  8:33 PM   Specimen: Nasopharyngeal Swab; Respiratory  Result Value Ref Range Status   Adenovirus NOT DETECTED NOT DETECTED Final   Coronavirus 229E NOT DETECTED NOT DETECTED Final    Comment: (NOTE) The Coronavirus on the Respiratory Panel, DOES NOT test for the novel  Coronavirus (2019 nCoV)    Coronavirus HKU1 NOT DETECTED NOT DETECTED Final   Coronavirus NL63 NOT DETECTED NOT DETECTED Final   Coronavirus OC43 NOT DETECTED NOT DETECTED Final   Metapneumovirus NOT DETECTED NOT DETECTED Final   Rhinovirus / Enterovirus NOT DETECTED NOT DETECTED Final   Influenza A NOT DETECTED NOT DETECTED Final   Influenza B NOT DETECTED NOT DETECTED Final   Parainfluenza Virus 1 NOT DETECTED NOT DETECTED Final   Parainfluenza Virus 2 NOT DETECTED NOT DETECTED Final   Parainfluenza Virus 3 NOT  DETECTED NOT DETECTED Final   Parainfluenza Virus 4 NOT DETECTED NOT DETECTED Final   Respiratory Syncytial Virus NOT DETECTED NOT DETECTED Final   Bordetella pertussis NOT DETECTED NOT DETECTED Final   Chlamydophila pneumoniae NOT DETECTED NOT DETECTED Final   Mycoplasma pneumoniae NOT DETECTED NOT DETECTED Final  C Difficile Quick Screen w PCR reflex     Status: None   Collection Time: 05/11/19 11:25 PM   Specimen: Stool  Result Value Ref Range Status   C Diff antigen NEGATIVE NEGATIVE Final   C Diff toxin NEGATIVE NEGATIVE Final   C Diff interpretation No C. difficile detected.  Final    Comment: Performed at [facility], [address] [place], [place] [address]     Labs: Basic Metabolic Panel: Recent Labs  Lab 05/11/19 1714 05/11/19 2002 05/12/19 0547 05/13/19 0511  NA 135  --  138 142  K 3.2*  --  3.5 3.6  CL 100  --  107 108  CO2 23  --  23 25  GLUCOSE 111*  --  99 89  BUN 14  --  9 7  CREATININE 0.82  --  0.66 0.67  CALCIUM 8.7*  --  8.1* 8.6*  MG  --  1.6*  --  2.3   Liver Function Tests: Recent Labs  Lab 05/11/19 1714 05/12/19 0547  AST 24 18  ALT 31 24  ALKPHOS 60 44  BILITOT 1.6* 1.1  PROT 7.6 5.7*  ALBUMIN 4.2 3.2*   Recent Labs  Lab 05/11/19 1714  LIPASE 19   No results for input(s): AMMONIA in the last 168 hours. CBC: Recent Labs  Lab 05/11/19 1714 05/12/19 0547  WBC 10.6* 4.9  NEUTROABS 9.4*  --   HGB 15.7 12.7*  HCT 47.7 39.3  MCV 89.5 90.6  PLT 220 169    Signed:  Barton Dubois MD.  [facility] 05/13/2019, 11:47 AM

## 2019-05-16 ENCOUNTER — Telehealth (INDEPENDENT_AMBULATORY_CARE_PROVIDER_SITE_OTHER): Payer: Medicaid Other | Admitting: Family

## 2019-05-16 ENCOUNTER — Other Ambulatory Visit: Payer: Self-pay

## 2019-05-16 ENCOUNTER — Encounter: Payer: Self-pay | Admitting: Family

## 2019-05-16 DIAGNOSIS — F819 Developmental disorder of scholastic skills, unspecified: Secondary | ICD-10-CM | POA: Diagnosis not present

## 2019-05-16 DIAGNOSIS — Z818 Family history of other mental and behavioral disorders: Secondary | ICD-10-CM

## 2019-05-16 DIAGNOSIS — F339 Major depressive disorder, recurrent, unspecified: Secondary | ICD-10-CM | POA: Diagnosis not present

## 2019-05-16 DIAGNOSIS — R251 Tremor, unspecified: Secondary | ICD-10-CM

## 2019-05-16 DIAGNOSIS — Z719 Counseling, unspecified: Secondary | ICD-10-CM | POA: Diagnosis not present

## 2019-05-16 DIAGNOSIS — F32A Depression, unspecified: Secondary | ICD-10-CM

## 2019-05-16 DIAGNOSIS — F329 Major depressive disorder, single episode, unspecified: Secondary | ICD-10-CM

## 2019-05-16 DIAGNOSIS — F900 Attention-deficit hyperactivity disorder, predominantly inattentive type: Secondary | ICD-10-CM

## 2019-05-16 DIAGNOSIS — F419 Anxiety disorder, unspecified: Secondary | ICD-10-CM | POA: Diagnosis not present

## 2019-05-16 DIAGNOSIS — Z79899 Other long term (current) drug therapy: Secondary | ICD-10-CM | POA: Diagnosis not present

## 2019-05-16 DIAGNOSIS — Z813 Family history of other psychoactive substance abuse and dependence: Secondary | ICD-10-CM | POA: Diagnosis not present

## 2019-05-16 DIAGNOSIS — G2569 Other tics of organic origin: Secondary | ICD-10-CM

## 2019-05-16 DIAGNOSIS — F411 Generalized anxiety disorder: Secondary | ICD-10-CM

## 2019-05-16 LAB — CULTURE, BLOOD (ROUTINE X 2)
Culture: NO GROWTH
Culture: NO GROWTH
Special Requests: ADEQUATE
Special Requests: ADEQUATE

## 2019-05-16 MED ORDER — LISDEXAMFETAMINE DIMESYLATE 60 MG PO CAPS: 60.0000 mg | ORAL_CAPSULE | Freq: Every day | ORAL | 0 refills | Status: DC

## 2019-05-16 MED ORDER — SERTRALINE HCL 50 MG PO TABS: 50.0000 mg | ORAL_TABLET | Freq: Two times a day (BID) | ORAL | 2 refills | Status: DC

## 2019-05-16 NOTE — Addendum Note (Signed)
Addended by: Carron Curie on: 05/16/2019 01:55 PM   Modules accepted: Orders

## 2019-05-16 NOTE — Progress Notes (Signed)
[facility] [address] [place] [address] Dept: [phone] Dept Fax: [phone]  Medication Check visit via Virtual Video due to COVID-19  Patient ID:  Henry [name]  male DOB: [birth year]   19 y.o.   MRN: [id number]   DATE:05/16/19  PCP: [name], [name], MD  Virtual Visit via Video Note  I connected with  Henry [name] on 05/16/19 at 11:00 AM EDT by a video enabled telemedicine application and verified that I am speaking with the correct person using two identifiers. Patient/Parent Location: in the vehicle, not driving.   I discussed the limitations, risks, security and privacy concerns of performing an evaluation and management service by telephone and the availability of in person appointments. I also discussed with the parents that there may be a patient responsible charge related to this service. The parents expressed understanding and agreed to proceed.  Provider: Carolann Littler, NP  Location: private location  HISTORY/CURRENT STATUS: Henry [name] is here for medication management of the psychoactive medications for ADHD and review of educational and behavioral concerns.   Henry [name] currently taking Zoloft and Vyvanse on occasion, which is not working well. Takes medication at 11-1:00 pm. Medication tends to wear off around evening time with the Vyvanse. Henry [name] is able to focus through chores or help around the house along with looking for work.   Henry [name] is eating well (eating breakfast, lunch and dinner). No issues with getting enough calories daily.  Sleeping well (goes to bed at 11-12:00 am wakes at 8-10:00 am), sleeping through the night.   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   Activities/ Exercise:  rarely  Screen time: (phone, tablet, TV, computer): TV, Phone, Games  MEDICAL HISTORY: Individual Medical History/ Review of Systems: Changes? :Yes, recent food poison with ED visit and admission for dehydration for 3 days. Treated with IV fluids and Abx.   Family Medical/ Social History: Changes? Yes grandfather with recent changes to his vision due to bleeding.  Patient Lives with: stepmother and stepfather-shared time between households, visits with father on rare occasions.   Current Medications:  Current Outpatient Medications  Medication Instructions  . famotidine (PEPCID) 20 mg, Oral, Daily  . lisdexamfetamine (VYVANSE) 60 mg, Oral, Daily  . ondansetron (ZOFRAN ODT) 8 mg, Oral, Every 8 hours PRN  . sertraline (ZOLOFT) 50 mg, Oral, 2 times daily   Medication Side Effects: None  MENTAL HEALTH: Mental Health Issues:   Depression and Anxiety    DIAGNOSES:    ICD-10-CM   1. ADHD (attention deficit hyperactivity disorder), inattentive type  F90.0 lisdexamfetamine (VYVANSE) 60 MG capsule  2. Anxiety and depression  F41.9 sertraline (ZOLOFT) 50 MG tablet   F32.9   3. Learning difficulty  F81.9   4. Family history of learning disability in father  Z37.8   64. Family history of drug addiction  Z81.3   6. Morbid obesity (Henry [name])  E66.01   7. Depression, recurrent (Henry [name])  F33.9   8. Tremor of right hand  R25.1   9. Generalized anxiety disorder  F41.1   10. 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 Medication management  Z79.899   12. 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  Revisited possibility of counseling to assist with relationship ongoing conflict with father. Advised to look at counselor through insurance and close proximity to home.  Information regarding job searching discussed at length with patient since [facility] was  unsuccessful.  Discussed health and current weight. Recommended healthy food choices, watching portion sizes, avoiding second helpings, avoiding sugary drinks like soda and tea, drinking more water, getting more exercise.   Discussed continued need for structure, routine, reward (external), motivation (internal), positive reinforcement, consequences, and organization with several living setting.  Encouraged recommended limitations on TV, tablets, phones, video games and computers for non-educational activities.   Discussed need for bedtime routine, use of good sleep hygiene, no video games, TV or phones for an hour before bedtime.   Encouraged physical activity and outdoor exercise when possible, maintaining social distancing.   Counseled medication pharmacokinetics, options, dosage, administration, desired effects, and possible side effects.   Vyvanse 60 mg daily, # 30 with no RF's Zoloft 50 mg BID, # 60 with 2 RF"s RX for above e-scribed and sent to pharmacy on record  [facility] [address], [place] - [address] [place] [address] Phone: [phone] Fax: [phone]  I discussed the assessment and treatment plan with the patient. The patient was provided an opportunity to ask questions and all were answered. The patient agreed with the plan and demonstrated an understanding of the instructions.   I provided 30 minutes of non-face-to-face time during this encounter.  Completed record review for 10 minutes prior to the virtual video visit.   NEXT APPOINTMENT:  Return in about 3 months (around 08/15/2019) for follow up visit.  The patient was advised to call back or seek an in-person evaluation if the symptoms worsen or if the condition fails to improve as anticipated.  Medical Decision-making: More than 50% of the appointment was spent counseling and discussing diagnosis and management of symptoms with the patient and family.  Carron Curie, NP

## 2019-06-12 DIAGNOSIS — Z23 Encounter for immunization: Secondary | ICD-10-CM | POA: Diagnosis not present

## 2019-07-08 DIAGNOSIS — Z23 Encounter for immunization: Secondary | ICD-10-CM | POA: Diagnosis not present

## 2019-10-14 ENCOUNTER — Encounter: Payer: Medicaid Other | Admitting: Family

## 2019-11-06 ENCOUNTER — Other Ambulatory Visit: Payer: Self-pay

## 2019-11-06 ENCOUNTER — Encounter: Payer: Self-pay | Admitting: Family Medicine

## 2019-11-06 ENCOUNTER — Ambulatory Visit (INDEPENDENT_AMBULATORY_CARE_PROVIDER_SITE_OTHER): Payer: Medicaid Other | Admitting: Family Medicine

## 2019-11-06 ENCOUNTER — Ambulatory Visit (INDEPENDENT_AMBULATORY_CARE_PROVIDER_SITE_OTHER): Payer: Medicaid Other

## 2019-11-06 VITALS — BP 126/80 | HR 87 | Temp 97.9°F | Ht 66.0 in | Wt 326.6 lb

## 2019-11-06 DIAGNOSIS — M79672 Pain in left foot: Secondary | ICD-10-CM | POA: Diagnosis not present

## 2019-11-06 DIAGNOSIS — M7661 Achilles tendinitis, right leg: Secondary | ICD-10-CM

## 2019-11-06 DIAGNOSIS — M7662 Achilles tendinitis, left leg: Secondary | ICD-10-CM

## 2019-11-06 MED ORDER — MELOXICAM 15 MG PO TABS: 15.0000 mg | ORAL_TABLET | Freq: Every day | ORAL | 0 refills | Status: DC

## 2019-11-06 MED ORDER — PREDNISONE 20 MG PO TABS: ORAL_TABLET | ORAL | 0 refills | Status: DC

## 2019-11-06 NOTE — Progress Notes (Signed)
Subjective: CC: acute left foot pain PCP: [name], [name], MD  EML:[id number] [name] is a 19 y.o. male presenting to clinic today for:  1. Left foot pain Henry slipped while getting out of shower 1 week ago, and landed with both feet everted. After he had pain and bruising to his left foot. He reports that both the bruising and pain has improved. The pain is now on the lateral aspect and across the top of his foot. Pain is a ache and sharp at times, ranges from a 2-7/10. The pain worsens after being on his feet for some time and it improves with rest. Advil provides some relief. He denies numbness or tingling in his foot.   2. 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 He has been podiatry in this past and had shoe inserts. He is in need of new inserts and would like a referral back to his podiatrist for this.   Relevant past medical, surgical, family, and social history reviewed and updated as indicated.  Allergies and medications reviewed and updated.  No Known Allergies Past Medical History:  Diagnosis Date  . Acne   . ADHD (attention deficit hyperactivity disorder)   . Anxiety   . Depression     Current Outpatient Medications:  .  lisdexamfetamine (VYVANSE) 60 MG capsule, Take 1 capsule (60 mg total) by mouth daily., Disp: 30 capsule, Rfl: 0 .  ondansetron (ZOFRAN ODT) 8 MG disintegrating tablet, Take 1 tablet (8 mg total) by mouth every 8 (eight) hours as needed for nausea or vomiting., Disp: 20 tablet, Rfl: 0 .  sertraline (ZOLOFT) 50 MG tablet, Take 1 tablet (50 mg total) by mouth 2 (two) times daily., Disp: 60 tablet, Rfl: 2 .  famotidine (PEPCID) 20 MG tablet, Take 1 tablet (20 mg total) by mouth daily., Disp: 30 tablet, Rfl: 1 Social History   Socioeconomic History  . Marital status: Single    Spouse name: Not on file  . Number of children: Not on file  . Years of education: Not on file  . Highest education level: Not on file    Occupational History  . Not on file  Tobacco Use  . Smoking status: Passive Smoke Exposure - Never Smoker  . Smokeless tobacco: Never Used  Vaping Use  . Vaping Use: Never used  Substance and Sexual Activity  . Alcohol use: No    Alcohol/week: 0.0 standard drinks  . Drug use: No  . Sexual activity: Never  Other Topics Concern  . 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 He lives with both parents and he has 1 sister. He enjoys playing video games and he loves history class   Social Determinants of Radio broadcast assistant Strain:   . Difficulty of Paying Living Expenses: Not on file  Food Insecurity:   . Worried About Charity fundraiser in the Last Year: Not on file  . Ran Out of Food in the Last Year: Not on file  Transportation Needs:   . Lack of Transportation (Medical): Not on file  . Lack of Transportation (Non-Medical): Not on file  Physical Activity:   . Days of Exercise per Week: Not on file  . Minutes of Exercise per Session: Not on file  Stress:   . Feeling of Stress : Not on file  Social Connections:   . Frequency of Communication with Friends and Family: Not on  file  . Frequency of Social Gatherings with Friends and Family: Not on file  . Attends Religious Services: Not on file  . Active Member of Clubs or Organizations: Not on file  . Attends Archivist Meetings: Not on file  . Marital Status: Not on file  Intimate Partner Violence:   . Fear of Current or Ex-Partner: Not on file  . Emotionally Abused: Not on file  . Physically Abused: Not on file  . Sexually Abused: Not on file   Family History  Problem Relation Age of Onset  . Thyroid disease Mother   . Diabetes Other   . Heart disease Other     Review of Systems  Per HPI.   Objective: Office vital signs reviewed. BP 126/80   Pulse 87   Temp 97.9 F (36.6 C) (Temporal)   Ht 5' 6"  (1.676 m)   Wt (!) 326 lb 9.6 oz (148.1  kg)   SpO2 96%   BMI 52.71 kg/m   Physical Examination:  Physical Exam Vitals and nursing note reviewed.  Constitutional:      General: He is not in acute distress.    Appearance: He is not ill-appearing or diaphoretic.  Musculoskeletal:     Left foot: Normal range of motion and normal capillary refill. No swelling or deformity.     Comments: TTP along lateral dorsal aspect. Mild bruising.  Skin:    General: Skin is warm.     Capillary Refill: Capillary refill takes less than 2 seconds.  Neurological:     General: No focal deficit present.     Mental Status: He is alert and oriented to person, place, and time.  Psychiatric:        Mood and Affect: Mood normal.        Behavior: Behavior normal.     Results for orders placed or performed during the hospital encounter of 05/11/19  Blood Culture (routine x 2)   Specimen: Right Antecubital; Blood  Result Value Ref Range   Specimen Description RIGHT ANTECUBITAL    Special Requests      BOTTLES DRAWN AEROBIC AND ANAEROBIC Blood Culture adequate volume   Culture      NO GROWTH 5 DAYS Performed at [facility], [address], [place], [place] [address]    Report Status 05/16/2019 FINAL   Blood Culture (routine x 2)   Specimen: BLOOD LEFT ARM  Result Value Ref Range   Specimen Description BLOOD LEFT ARM    Special Requests      BOTTLES DRAWN AEROBIC AND ANAEROBIC Blood Culture adequate volume   Culture      NO GROWTH 5 DAYS Performed at [facility], [address], [place], [place] [address]    Report Status 05/16/2019 FINAL   Gastrointestinal Panel by PCR , Stool   Specimen: Stool  Result Value Ref Range   Campylobacter species NOT DETECTED NOT DETECTED   Plesimonas shigelloides NOT DETECTED NOT DETECTED   Salmonella species NOT DETECTED NOT DETECTED   Yersinia enterocolitica NOT DETECTED NOT DETECTED   Vibrio species NOT DETECTED NOT DETECTED   Vibrio cholerae NOT DETECTED NOT DETECTED   Enteroaggregative E coli  (EAEC) NOT DETECTED NOT DETECTED   Enteropathogenic E coli (EPEC) NOT DETECTED NOT DETECTED   Enterotoxigenic E coli (ETEC) NOT DETECTED NOT DETECTED   Shiga like toxin producing E coli (STEC) NOT DETECTED NOT DETECTED   Shigella/Enteroinvasive E coli (EIEC) NOT DETECTED NOT DETECTED   Cryptosporidium NOT DETECTED NOT DETECTED  Cyclospora cayetanensis NOT DETECTED NOT DETECTED   Entamoeba histolytica NOT DETECTED NOT DETECTED   Giardia lamblia NOT DETECTED NOT DETECTED   Adenovirus F40/41 NOT DETECTED NOT DETECTED   Astrovirus NOT DETECTED NOT DETECTED   Norovirus GI/GII DETECTED (A) NOT DETECTED   Rotavirus A NOT DETECTED NOT DETECTED   Sapovirus (I, II, IV, and V) NOT DETECTED NOT DETECTED  C Difficile Quick Screen w PCR reflex   Specimen: Stool  Result Value Ref Range   C Diff antigen NEGATIVE NEGATIVE   C Diff toxin NEGATIVE NEGATIVE   C Diff interpretation No C. difficile detected.   Respiratory Panel by PCR   Specimen: Nasopharyngeal Swab; Respiratory  Result Value Ref Range   Adenovirus NOT DETECTED NOT DETECTED   Coronavirus 229E NOT DETECTED NOT DETECTED   Coronavirus HKU1 NOT DETECTED NOT DETECTED   Coronavirus NL63 NOT DETECTED NOT DETECTED   Coronavirus OC43 NOT DETECTED NOT DETECTED   Metapneumovirus NOT DETECTED NOT DETECTED   Rhinovirus / Enterovirus NOT DETECTED NOT DETECTED   Influenza A NOT DETECTED NOT DETECTED   Influenza B NOT DETECTED NOT DETECTED   Parainfluenza Virus 1 NOT DETECTED NOT DETECTED   Parainfluenza Virus 2 NOT DETECTED NOT DETECTED   Parainfluenza Virus 3 NOT DETECTED NOT DETECTED   Parainfluenza Virus 4 NOT DETECTED NOT DETECTED   Respiratory Syncytial Virus NOT DETECTED NOT DETECTED   Bordetella pertussis NOT DETECTED NOT DETECTED   Chlamydophila pneumoniae NOT DETECTED NOT DETECTED   Mycoplasma pneumoniae NOT DETECTED NOT DETECTED  Comprehensive metabolic panel  Result Value Ref Range   Sodium 135 135 - 145 mmol/L   Potassium 3.2 (L)  3.5 - 5.1 mmol/L   Chloride 100 98 - 111 mmol/L   CO2 23 22 - 32 mmol/L   Glucose, Bld 111 (H) 70 - 99 mg/dL   BUN 14 6 - 20 mg/dL   Creatinine, Ser 0.82 0.61 - 1.24 mg/dL   Calcium 8.7 (L) 8.9 - 10.3 mg/dL   Total Protein 7.6 6.5 - 8.1 g/dL   Albumin 4.2 3.5 - 5.0 g/dL   AST 24 15 - 41 U/L   ALT 31 0 - 44 U/L   Alkaline Phosphatase 60 38 - 126 U/L   Total Bilirubin 1.6 (H) 0.3 - 1.2 mg/dL   GFR calc non Af Amer >60 >60 mL/min   GFR calc Af Amer >60 >60 mL/min   Anion gap 12 5 - 15  CBC WITH DIFFERENTIAL  Result Value Ref Range   WBC 10.6 (H) 4.0 - 10.5 K/uL   RBC 5.33 4.22 - 5.81 MIL/uL   Hemoglobin 15.7 13.0 - 17.0 g/dL   HCT 47.7 39 - 52 %   MCV 89.5 80.0 - 100.0 fL   MCH 29.5 26.0 - 34.0 pg   MCHC 32.9 30.0 - 36.0 g/dL   RDW 13.3 11.5 - 15.5 %   Platelets 220 150 - 400 K/uL   nRBC 0.0 0.0 - 0.2 %   Neutrophils Relative % 89 %   Neutro Abs 9.4 (H) 1.7 - 7.7 K/uL   Lymphocytes Relative 6 %   Lymphs Abs 0.6 (L) 0.7 - 4.0 K/uL   Monocytes Relative 5 %   Monocytes Absolute 0.5 0.1 - 1.0 K/uL   Eosinophils Relative 0 %   Eosinophils Absolute 0.0 0.0 - 0.5 K/uL   Basophils Relative 0 %   Basophils Absolute 0.0 0.0 - 0.1 K/uL   Immature Granulocytes 0 %   Abs Immature Granulocytes 0.04  0.00 - 0.07 K/uL  Lipase, blood  Result Value Ref Range   Lipase 19 11 - 51 U/L  Lactic acid, plasma  Result Value Ref Range   Lactic Acid, Venous 1.8 0.5 - 1.9 mmol/L  Magnesium  Result Value Ref Range   Magnesium 1.6 (L) 1.7 - 2.4 mg/dL  Urine rapid drug screen (hosp performed)  Result Value Ref Range   Opiates NONE DETECTED NONE DETECTED   Cocaine NONE DETECTED NONE DETECTED   Benzodiazepines NONE DETECTED NONE DETECTED   Amphetamines NONE DETECTED NONE DETECTED   Tetrahydrocannabinol NONE DETECTED NONE DETECTED   Barbiturates NONE DETECTED NONE DETECTED  Urinalysis, Routine w reflex microscopic  Result Value Ref Range   Color, Urine YELLOW YELLOW   APPearance CLEAR CLEAR    Specific Gravity, Urine 1.032 (H) 1.005 - 1.030   pH 6.0 5.0 - 8.0   Glucose, UA NEGATIVE NEGATIVE mg/dL   Hgb urine dipstick NEGATIVE NEGATIVE   Bilirubin Urine NEGATIVE NEGATIVE   Ketones, ur NEGATIVE NEGATIVE mg/dL   Protein, ur NEGATIVE NEGATIVE mg/dL   Nitrite NEGATIVE NEGATIVE   Leukocytes,Ua NEGATIVE NEGATIVE  CBC  Result Value Ref Range   WBC 4.9 4.0 - 10.5 K/uL   RBC 4.34 4.22 - 5.81 MIL/uL   Hemoglobin 12.7 (L) 13.0 - 17.0 g/dL   HCT 39.3 39 - 52 %   MCV 90.6 80.0 - 100.0 fL   MCH 29.3 26.0 - 34.0 pg   MCHC 32.3 30.0 - 36.0 g/dL   RDW 13.4 11.5 - 15.5 %   Platelets 169 150 - 400 K/uL   nRBC 0.0 0.0 - 0.2 %  Comprehensive metabolic panel  Result Value Ref Range   Sodium 138 135 - 145 mmol/L   Potassium 3.5 3.5 - 5.1 mmol/L   Chloride 107 98 - 111 mmol/L   CO2 23 22 - 32 mmol/L   Glucose, Bld 99 70 - 99 mg/dL   BUN 9 6 - 20 mg/dL   Creatinine, Ser 0.66 0.61 - 1.24 mg/dL   Calcium 8.1 (L) 8.9 - 10.3 mg/dL   Total Protein 5.7 (L) 6.5 - 8.1 g/dL   Albumin 3.2 (L) 3.5 - 5.0 g/dL   AST 18 15 - 41 U/L   ALT 24 0 - 44 U/L   Alkaline Phosphatase 44 38 - 126 U/L   Total Bilirubin 1.1 0.3 - 1.2 mg/dL   GFR calc non Af Amer >60 >60 mL/min   GFR calc Af Amer >60 >60 mL/min   Anion gap 8 5 - 15  HIV Antibody (routine testing w rflx)  Result Value Ref Range   HIV Screen 4th Generation wRfx NON REACTIVE NON REACTIVE  Magnesium  Result Value Ref Range   Magnesium 2.3 1.7 - 2.4 mg/dL  Basic metabolic panel  Result Value Ref Range   Sodium 142 135 - 145 mmol/L   Potassium 3.6 3.5 - 5.1 mmol/L   Chloride 108 98 - 111 mmol/L   CO2 25 22 - 32 mmol/L   Glucose, Bld 89 70 - 99 mg/dL   BUN 7 6 - 20 mg/dL   Creatinine, Ser 0.67 0.61 - 1.24 mg/dL   Calcium 8.6 (L) 8.9 - 10.3 mg/dL   GFR calc non Af Amer >60 >60 mL/min   GFR calc Af Amer >60 >60 mL/min   Anion gap 9 5 - 15  POC SARS Coronavirus 2 Ag-ED - Nasal Swab (BD Veritor Kit)  Result Value Ref Range   SARS Coronavirus  2 Ag NEGATIVE NEGATIVE     Assessment/ Plan: Henry [name] was seen today for foot pain.  Diagnoses and all orders for this visit:  Left foot pain No fractures noted on xray today-radiology report is pending. Medications as below. Do not take advil or other NSAIDs with mobic. Rest as needed, heat, elevation. Return to office for new or worsening symptoms, or if symptoms persist.  -     DG Foot Complete Left; Future -     predniSONE (DELTASONE) 20 MG tablet; 2 po at sametime daily for 5 days -     meloxicam (MOBIC) 15 MG tablet; Take 1 tablet (15 mg total) by mouth daily.  Achilles tendonitis, bilateral Per patient request for new shoe inserts. -     Ambulatory referral to Podiatry  The above assessment and management plan was discussed with the patient. The patient verbalized understanding of and has agreed to the management plan. Patient is aware to call the clinic if symptoms persist or worsen. Patient is aware when to return to the clinic for a follow-up visit. Patient educated on when it is appropriate to go to the emergency department.   Marjorie Smolder, FNP-C [facility] [address] [place], [place] [address] [phone]

## 2019-11-06 NOTE — Patient Instructions (Signed)

## 2019-11-13 ENCOUNTER — Encounter: Payer: Medicaid Other | Admitting: Family

## 2019-11-19 ENCOUNTER — Ambulatory Visit (INDEPENDENT_AMBULATORY_CARE_PROVIDER_SITE_OTHER): Payer: Medicaid Other | Admitting: Podiatry

## 2019-11-19 ENCOUNTER — Other Ambulatory Visit: Payer: Self-pay

## 2019-11-19 DIAGNOSIS — Q666 Other congenital valgus deformities of feet: Secondary | ICD-10-CM | POA: Diagnosis not present

## 2019-11-20 ENCOUNTER — Encounter: Payer: Self-pay | Admitting: Podiatry

## 2019-11-20 NOTE — Progress Notes (Signed)
Subjective:  Patient ID: Henry [name], male    DOB: 2000/11/20,  MRN: [id number]  Chief Complaint  Patient presents with  . Foot Pain    PT stated that he would like to be tested fot gout and a referral for inserts     19 y.o. male presents with the above complaint.  Patient presents with complaint for bilateral flat feet.  Patient states that it hurts him in the arch of the foot sometimes when he is not being supported and when he has been on his feet for long period of time.  Patient states that he would like to discuss orthotics management.  He had orthotics in the past which tend to be worn out.  He would like to know if he can get another pair and be referred for them.  He denies any other acute complaints.  He also has secondary question about possibly being tested for gout as he has family history of gout however I discussed with him that unless if he is having acute gout flare it is very hard to do her lab work to catch it.  Ultimately he will need to do a uric acid test which can only be picked up really well when he is having an acute gout flare.  I discussed with the patient.  Patient states understanding will hold off   Review of Systems: Negative except as noted in the HPI. Denies N/V/F/Ch.  Past Medical History:  Diagnosis Date  . Acne   . ADHD (attention deficit hyperactivity disorder)   . Anxiety   . Depression     Current Outpatient Medications:  .  famotidine (PEPCID) 20 MG tablet, Take 1 tablet (20 mg total) by mouth daily., Disp: 30 tablet, Rfl: 1 .  lisdexamfetamine (VYVANSE) 60 MG capsule, Take 1 capsule (60 mg total) by mouth daily., Disp: 30 capsule, Rfl: 0 .  meloxicam (MOBIC) 15 MG tablet, Take 1 tablet (15 mg total) by mouth daily., Disp: 30 tablet, Rfl: 0 .  ondansetron (ZOFRAN ODT) 8 MG disintegrating tablet, Take 1 tablet (8 mg total) by mouth every 8 (eight) hours as needed for nausea or vomiting., Disp: 20 tablet, Rfl: 0 .  predniSONE (DELTASONE) 20 MG  tablet, 2 po at sametime daily for 5 days, Disp: 10 tablet, Rfl: 0 .  sertraline (ZOLOFT) 50 MG tablet, Take 1 tablet (50 mg total) by mouth 2 (two) times daily., Disp: 60 tablet, Rfl: 2  Social History   Tobacco Use  Smoking Status Passive Smoke Exposure - Never Smoker  Smokeless Tobacco Never Used    No Known Allergies Objective:  There were no vitals filed for this visit. There is no height or weight on file to calculate BMI. Constitutional Well developed. Well nourished.  Vascular Dorsalis pedis pulses palpable bilaterally. Posterior tibial pulses palpable bilaterally. Capillary refill normal to all digits.  No cyanosis or clubbing noted. Pedal hair growth normal.  Neurologic Normal speech. Oriented to person, place, and time. Epicritic sensation to light touch grossly present bilaterally.  Dermatologic Nails well groomed and normal in appearance. No open wounds. No skin lesions.  Orthopedic:  Gait examination shows severe pes planovalgus deformity with calcaneal eversion too many toe signs unable to recruit the arch with dorsiflexion of the hallux medial column collapse.  Unable to return back to neutral position with single and double heel raises.   Radiographs: None Assessment:   1. Pes planovalgus    Plan:  Patient was evaluated and treated and all  questions answered.  Bilateral pes planovalgus semirigid -I explained to the patient the etiology of pes planovalgus and various treatment options were extensively discussed.  Ultimately I discussed with the patient that he will benefit from custom-made orthotics however at this time patient would like to discuss the financial cost of them.  I ultimately told him the cost of the orthotics.  He will think about it and will get back to me about getting orthotics made.  No follow-ups on file.

## 2019-11-24 ENCOUNTER — Ambulatory Visit
Admission: EM | Admit: 2019-11-24 | Discharge: 2019-11-24 | Disposition: A | Discharge status: Home / Self Care | Payer: Medicaid Other | Attending: Emergency Medicine | Admitting: Emergency Medicine

## 2019-11-24 ENCOUNTER — Other Ambulatory Visit: Payer: Self-pay

## 2019-11-24 ENCOUNTER — Encounter: Payer: Self-pay | Admitting: Emergency Medicine

## 2019-11-24 DIAGNOSIS — M25562 Pain in left knee: Secondary | ICD-10-CM | POA: Diagnosis not present

## 2019-11-24 DIAGNOSIS — J069 Acute upper respiratory infection, unspecified: Secondary | ICD-10-CM | POA: Diagnosis not present

## 2019-11-24 MED ORDER — DEXAMETHASONE 4 MG PO TABS: 4.0000 mg | ORAL_TABLET | Freq: Every day | ORAL | 0 refills | Status: AC

## 2019-11-24 MED ORDER — CETIRIZINE HCL 10 MG PO TABS: 10.0000 mg | ORAL_TABLET | Freq: Every day | ORAL | 0 refills | Status: DC

## 2019-11-24 MED ORDER — FLUTICASONE PROPIONATE 50 MCG/ACT NA SUSP
1.0000 | Freq: Every day | NASAL | 0 refills | Status: DC
Start: 2019-11-24 — End: 2020-04-28

## 2019-11-24 NOTE — ED Triage Notes (Addendum)
Reports his LT knee has been "buckling" for past couple of days.  when he walks at work. Does report he tripped over his dog Friday night.  Also report sore throat, congestion and runny nose since yesterday

## 2019-11-24 NOTE — ED Provider Notes (Signed)
Henry [name]   [id number] 11/24/19 Arrival Time: 0957   Chief Complaint  Patient presents with  . Nasal Congestion     SUBJECTIVE: History from: patient.  Henry [name] is a 19 y.o. male who presented to the urgent care with a complaint of sore throat, nasal congestion runny nose that started yesterday.  Denies sick exposure to COVID, flu or strep.  Denies recent travel.  Has tried OTC DayQuil without relief.  Denies aggravating or alleviating factors.  Denies previous symptoms in the past.   Denies fever, chills, fatigue, sinus pain, rhinorrhea, sore throat, SOB, wheezing, chest pain, nausea, changes in bowel or bladder habits.    He is also complaining of left knee buckling for the past couple days.  Report he work at [employer] when he walked a lot.  Has a history of arthritis and is currently taking meloxicam.  Denies any trauma or injury.  Denies similar symptoms in the past.  Denies chills, fever, nausea, vomiting, diarrhea, trauma, injury, falls.  ROS: As per HPI.  All other pertinent ROS negative.     Past Medical History:  Diagnosis Date  . Acne   . ADHD (attention deficit hyperactivity disorder)   . Anxiety   . Depression    History reviewed. No pertinent surgical history. No Known Allergies No current facility-administered medications on file prior to encounter.   Current Outpatient Medications on File Prior to Encounter  Medication Sig Dispense Refill  . famotidine (PEPCID) 20 MG tablet Take 1 tablet (20 mg total) by mouth daily. 30 tablet 1  . lisdexamfetamine (VYVANSE) 60 MG capsule Take 1 capsule (60 mg total) by mouth daily. 30 capsule 0  . meloxicam (MOBIC) 15 MG tablet Take 1 tablet (15 mg total) by mouth daily. 30 tablet 0  . ondansetron (ZOFRAN ODT) 8 MG disintegrating tablet Take 1 tablet (8 mg total) by mouth every 8 (eight) hours as needed for nausea or vomiting. 20 tablet 0  . predniSONE (DELTASONE) 20 MG tablet 2 po at sametime daily for 5 days  10 tablet 0  . sertraline (ZOLOFT) 50 MG tablet Take 1 tablet (50 mg total) by mouth 2 (two) times daily. 60 tablet 2   Social History   Socioeconomic History  . Marital status: Single    Spouse name: Not on file  . Number of children: Not on file  . Years of education: Not on file  . Highest education level: Not on file  Occupational History  . Not on file  Tobacco Use  . Smoking status: Passive Smoke Exposure - Never Smoker  . Smokeless tobacco: Never Used  Vaping Use  . Vaping Use: Never used  Substance and Sexual Activity  . Alcohol use: No    Alcohol/week: 0.0 standard drinks  . Drug use: No  . Sexual activity: Never  Other Topics Concern  . 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 He lives with both parents and he has 1 sister. He enjoys playing video games and he loves history class   Social Determinants of Corporate investment banker Strain:   . Difficulty of Paying Living Expenses: Not on file  Food Insecurity:   . Worried About Programme researcher, broadcasting/film/video in the Last Year: Not on file  . Ran Out of Food in the Last Year: Not on file  Transportation Needs:   . Lack of Transportation (Medical): Not on file  . Lack  of Transportation (Non-Medical): Not on file  Physical Activity:   . Days of Exercise per Week: Not on file  . Minutes of Exercise per Session: Not on file  Stress:   . Feeling of Stress : Not on file  Social Connections:   . Frequency of Communication with Friends and Family: Not on file  . Frequency of Social Gatherings with Friends and Family: Not on file  . Attends Religious Services: Not on file  . Active Member of Clubs or Organizations: Not on file  . Attends Banker Meetings: Not on file  . Marital Status: Not on file  Intimate Partner Violence:   . Fear of Current or Ex-Partner: Not on file  . Emotionally Abused: Not on file  . Physically Abused: Not on file  . Sexually  Abused: Not on file   Family History  Problem Relation Age of Onset  . Thyroid disease Mother   . Diabetes Other   . Heart disease Other     OBJECTIVE:  Vitals:   11/24/19 1021 11/24/19 1023 11/24/19 1024  BP:  125/66   Pulse: 87    Resp: 19    Temp: 98.8 F (37.1 C)    TempSrc: Oral    SpO2: 97%    Weight:   (!) 330 lb (149.7 kg)  Height:   5\' 6"  (1.676 m)     Physical Exam Vitals and nursing note reviewed.  Constitutional:      General: He is not in acute distress.    Appearance: Normal appearance. He is normal weight. He is not ill-appearing, toxic-appearing or diaphoretic.  Cardiovascular:     Rate and Rhythm: Normal rate and regular rhythm.     Pulses: Normal pulses.     Heart sounds: Normal heart sounds. No murmur heard.  No friction rub. No gallop.   Pulmonary:     Effort: Pulmonary effort is normal. No respiratory distress.     Breath sounds: Normal breath sounds. No stridor. No wheezing, rhonchi or rales.  Chest:     Chest wall: No tenderness.  Musculoskeletal:        General: Tenderness present.     Right knee: Normal.     Left knee: Tenderness present.     Comments: The left knee is without any obvious asymmetry or deformity when compared to the right knee.  There is no ecchymosis, open wound, crepitus, lesion, warmth or surface trauma present.  No tenderness on massage of calf muscle.  Neurovascular status intact.  Limited range of motion due to pain.  Neurological:     Mental Status: He is alert and oriented to person, place, and time.      LABS:  No results found for this or any previous visit (from the past 24 hour(s)).   ASSESSMENT & PLAN:  1. Acute pain of left knee   2. Acute URI     Meds ordered this encounter  Medications  . cetirizine (ZYRTEC ALLERGY) 10 MG tablet    Sig: Take 1 tablet (10 mg total) by mouth daily.    Dispense:  30 tablet    Refill:  0  . fluticasone (FLONASE) 50 MCG/ACT nasal spray    Sig: Place 1 spray into  both nostrils daily for 14 days.    Dispense:  16 g    Refill:  0  . dexamethasone (DECADRON) 4 MG tablet    Sig: Take 1 tablet (4 mg total) by mouth daily for 7 days.  Dispense:  7 tablet    Refill:  0   Left knee pain is more likely from arthritis.  He was advised to continue to take meloxicam as prescribed.  MRI will need to be completed to rule out any ligament tear.  Discharge instructions  Get plenty of rest and push fluids Continue to take meloxicam as prescribed for arthritis pain.  Do not mix with other NSAID as it may increase the risk of GI bleed zyrtec for nasal congestion, runny nose, and/or sore throat flonase for nasal congestion and runny nose Decadron was prescribed/take as directed Follow-up with PCP Use medications daily for symptom relief Call or go to the ED if you have any new or worsening symptoms such as fever, worsening cough, shortness of breath, chest tightness, chest pain, turning blue, changes in mental status, etc...   Reviewed expectations re: course of current medical issues. Questions answered. Outlined signs and symptoms indicating need for more acute intervention. Patient verbalized understanding. After Visit Summary given.         Durward Parcel, FNP 11/24/19 1130

## 2019-11-24 NOTE — Discharge Instructions (Addendum)
Get plenty of rest and push fluids Continue to take meloxicam as prescribed for arthritis pain.  Do not mix with other NSAID as it may increase the risk of GI bleed zyrtec for nasal congestion, runny nose, and/or sore throat flonase for nasal congestion and runny nose Decadron was prescribed/take as directed Follow-up with PCP Use medications daily for symptom relief Call or go to the ED if you have any new or worsening symptoms such as fever, worsening cough, shortness of breath, chest tightness, chest pain, turning blue, changes in mental status, etc..Marland Kitchen

## 2019-12-09 ENCOUNTER — Encounter: Payer: Self-pay | Admitting: Nurse Practitioner

## 2019-12-09 ENCOUNTER — Other Ambulatory Visit: Payer: Self-pay

## 2019-12-09 ENCOUNTER — Ambulatory Visit (INDEPENDENT_AMBULATORY_CARE_PROVIDER_SITE_OTHER): Payer: Medicaid Other

## 2019-12-09 ENCOUNTER — Ambulatory Visit (INDEPENDENT_AMBULATORY_CARE_PROVIDER_SITE_OTHER): Payer: Medicaid Other | Admitting: Nurse Practitioner

## 2019-12-09 VITALS — BP 106/61 | HR 86 | Temp 97.8°F | Resp 20 | Ht 66.0 in | Wt 327.0 lb

## 2019-12-09 DIAGNOSIS — M25561 Pain in right knee: Secondary | ICD-10-CM

## 2019-12-09 DIAGNOSIS — M25562 Pain in left knee: Secondary | ICD-10-CM | POA: Insufficient documentation

## 2019-12-09 DIAGNOSIS — M79604 Pain in right leg: Secondary | ICD-10-CM

## 2019-12-09 DIAGNOSIS — M79605 Pain in left leg: Secondary | ICD-10-CM | POA: Diagnosis not present

## 2019-12-09 MED ORDER — IBUPROFEN 600 MG PO TABS: 600.0000 mg | ORAL_TABLET | Freq: Three times a day (TID) | ORAL | 0 refills | Status: DC | PRN

## 2019-12-09 MED ORDER — DICLOFENAC SODIUM 1 % EX GEL: 2.0000 g | Freq: Four times a day (QID) | CUTANEOUS | 3 refills | Status: DC

## 2019-12-09 NOTE — Progress Notes (Signed)
Acute Office Visit  Subjective:    Patient ID: Henry [name], male    DOB: 03-24-2000, 19 y.o.   MRN: [id number]  Chief Complaint  Patient presents with  . lower extremity pain and weakness    Seen Urgent care     Knee Pain  The incident occurred more than 1 week ago. The incident occurred at work. There was no injury mechanism. The pain is present in the left knee and right knee. The pain is at a severity of 6/10. The pain is moderate. The pain has been intermittent since onset. Pertinent negatives include no muscle weakness, numbness or tingling. He reports no foreign bodies present. The symptoms are aggravated by movement and palpation. He has tried nothing for the symptoms. The treatment provided no relief.    Past Medical History:  Diagnosis Date  . Acne   . ADHD (attention deficit hyperactivity disorder)   . Anxiety   . Depression     History reviewed. No pertinent surgical history.  Family History  Problem Relation Age of Onset  . Thyroid disease Mother   . Diabetes Other   . Heart disease Other     Social History   Socioeconomic History  . Marital status: Single    Spouse name: Not on file  . Number of children: Not on file  . Years of education: Not on file  . Highest education level: Not on file  Occupational History  . Not on file  Tobacco Use  . Smoking status: Passive Smoke Exposure - Never Smoker  . Smokeless tobacco: Never Used  Vaping Use  . Vaping Use: Never used  Substance and Sexual Activity  . Alcohol use: No    Alcohol/week: 0.0 standard drinks  . Drug use: No  . Sexual activity: Never  Other Topics Concern  . 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 He lives with both parents and he has 1 sister. He enjoys playing video games and he loves history class   Social Determinants of Corporate investment banker Strain:   . Difficulty of Paying Living Expenses: Not on file  Food  Insecurity:   . Worried About Programme researcher, broadcasting/film/video in the Last Year: Not on file  . Ran Out of Food in the Last Year: Not on file  Transportation Needs:   . Lack of Transportation (Medical): Not on file  . Lack of Transportation (Non-Medical): Not on file  Physical Activity:   . Days of Exercise per Week: Not on file  . Minutes of Exercise per Session: Not on file  Stress:   . Feeling of Stress : Not on file  Social Connections:   . Frequency of Communication with Friends and Family: Not on file  . Frequency of Social Gatherings with Friends and Family: Not on file  . Attends Religious Services: Not on file  . Active Member of Clubs or Organizations: Not on file  . Attends Banker Meetings: Not on file  . Marital Status: Not on file  Intimate Partner Violence:   . Fear of Current or Ex-Partner: Not on file  . Emotionally Abused: Not on file  . Physically Abused: Not on file  . Sexually Abused: Not on file    Outpatient Medications Prior to Visit  Medication Sig Dispense Refill  . cetirizine (ZYRTEC ALLERGY) 10 MG tablet Take 1 tablet (10 mg total) by mouth daily. 30 tablet 0  .  sertraline (ZOLOFT) 50 MG tablet Take 1 tablet (50 mg total) by mouth 2 (two) times daily. 60 tablet 2  . fluticasone (FLONASE) 50 MCG/ACT nasal spray Place 1 spray into both nostrils daily for 14 days. (Patient not taking: Reported on 12/09/2019) 16 g 0  . lisdexamfetamine (VYVANSE) 60 MG capsule Take 1 capsule (60 mg total) by mouth daily. (Patient not taking: Reported on 12/09/2019) 30 capsule 0  . famotidine (PEPCID) 20 MG tablet Take 1 tablet (20 mg total) by mouth daily. 30 tablet 1  . meloxicam (MOBIC) 15 MG tablet Take 1 tablet (15 mg total) by mouth daily. 30 tablet 0  . ondansetron (ZOFRAN ODT) 8 MG disintegrating tablet Take 1 tablet (8 mg total) by mouth every 8 (eight) hours as needed for nausea or vomiting. 20 tablet 0  . predniSONE (DELTASONE) 20 MG tablet 2 po at sametime daily for  5 days 10 tablet 0   No facility-administered medications prior to visit.    No Known Allergies  Review of Systems  Constitutional: Negative for chills and fatigue.  Musculoskeletal:       Knee pain  Neurological: Negative for tingling and numbness.  Psychiatric/Behavioral: Negative for self-injury and sleep disturbance. The patient is not nervous/anxious.   All other systems reviewed and are negative.      Objective:    Physical Exam Vitals reviewed.  Constitutional:      Appearance: He is obese.  HENT:     Head: Normocephalic.     Nose: Nose normal.  Eyes:     Conjunctiva/sclera: Conjunctivae normal.  Cardiovascular:     Rate and Rhythm: Normal rate and regular rhythm.     Pulses: Normal pulses.     Heart sounds: Normal heart sounds.  Pulmonary:     Effort: Pulmonary effort is normal.     Breath sounds: Normal breath sounds.  Abdominal:     General: Bowel sounds are normal.  Musculoskeletal:        General: Tenderness present.     Comments: Bilateral leg pain  Neurological:     Mental Status: He is alert and oriented to person, place, and time.  Psychiatric:        Mood and Affect: Mood normal.     BP 106/61   Pulse 86   Temp 97.8 F (36.6 C)   Resp 20   Ht 5\' 6"  (1.676 m)   Wt (!) 327 lb (148.3 kg)   SpO2 97%   BMI 52.78 kg/m  Wt Readings from Last 3 Encounters:  12/09/19 (!) 327 lb (148.3 kg)  11/24/19 (!) 330 lb (149.7 kg)  11/06/19 (!) 326 lb 9.6 oz (148.1 kg)    Health Maintenance Due  Topic Date Due  . Hepatitis C Screening  Never done  . INFLUENZA VACCINE  Never done    There are no preventive care reminders to display for this patient.   Lab Results  Component Value Date   TSH 6.890 (H) 07/04/2016   Lab Results  Component Value Date   WBC 4.9 05/12/2019   HGB 12.7 (L) 05/12/2019   HCT 39.3 05/12/2019   MCV 90.6 05/12/2019   PLT 169 05/12/2019   Lab Results  Component Value Date   NA 142 05/13/2019   K 3.6 05/13/2019    CO2 25 05/13/2019   GLUCOSE 89 05/13/2019   BUN 7 05/13/2019   CREATININE 0.67 05/13/2019   BILITOT 1.1 05/12/2019   ALKPHOS 44 05/12/2019   AST 18 05/12/2019  ALT 24 05/12/2019   PROT 5.7 (L) 05/12/2019   ALBUMIN 3.2 (L) 05/12/2019   CALCIUM 8.6 (L) 05/13/2019   ANIONGAP 9 05/13/2019   Lab Results  Component Value Date   CHOL 192 (H) 07/04/2016   Lab Results  Component Value Date   HDL 34 (L) 07/04/2016   Lab Results  Component Value Date   LDLCALC 119 (H) 07/04/2016   Lab Results  Component Value Date   TRIG 193 (H) 07/04/2016   Lab Results  Component Value Date   CHOLHDL 5.6 (H) 07/04/2016   No results found for: HGBA1C     Assessment & Plan:   Problem List Items Addressed This Visit      Other   Pain in both knees - Primary    Bilateral knee pain not well managed.  Started patient on ibuprofen anti-inflammatory 600 mg every 12 hours as needed.  Ice application to both knee joints, resting knee, brace, advised patient on weight loss.  Provided education with printed handouts given.  Rx sent to pharmacy. Follow up with worsening or unresolved symptoms.  X-ray completed results pending.      Relevant Orders   DG Knee 1-2 Views Left   DG Knee 1-2 Views Right    Other Visit Diagnoses    Bilateral lower extremity pain       Relevant Orders   DG Knee 1-2 Views Left   DG Knee 1-2 Views Right       Meds ordered this encounter  Medications  . ibuprofen (ADVIL) 600 MG tablet    Sig: Take 1 tablet (600 mg total) by mouth every 8 (eight) hours as needed.    Dispense:  30 tablet    Refill:  0    Order Specific Question:   Supervising Provider    Answer:   [name] A [id number]  . diclofenac Sodium (VOLTAREN) 1 % GEL    Sig: Apply 2 g topically 4 (four) times daily.    Dispense:  50 g    Refill:  3    Order Specific Question:   Supervising Provider    Answer:   [name] A [[id number]]     Daryll Drown, NP

## 2019-12-09 NOTE — Patient Instructions (Signed)

## 2019-12-09 NOTE — Assessment & Plan Note (Signed)
Bilateral knee pain not well managed.  Started patient on ibuprofen anti-inflammatory 600 mg every 12 hours as needed.  Ice application to both knee joints, resting knee, brace, advised patient on weight loss.  Provided education with printed handouts given.  Rx sent to pharmacy. Follow up with worsening or unresolved symptoms.  X-ray completed results pending.

## 2020-01-06 DIAGNOSIS — Z23 Encounter for immunization: Secondary | ICD-10-CM | POA: Diagnosis not present

## 2020-01-27 ENCOUNTER — Encounter: Payer: Medicaid Other | Admitting: Family

## 2020-02-03 ENCOUNTER — Other Ambulatory Visit: Payer: Self-pay

## 2020-02-03 ENCOUNTER — Encounter: Payer: Self-pay | Admitting: Family

## 2020-02-03 ENCOUNTER — Ambulatory Visit (INDEPENDENT_AMBULATORY_CARE_PROVIDER_SITE_OTHER): Payer: Medicaid Other | Admitting: Family

## 2020-02-03 VITALS — BP 112/68 | HR 78 | Resp 18 | Ht 66.14 in | Wt 327.2 lb

## 2020-02-03 DIAGNOSIS — Z719 Counseling, unspecified: Secondary | ICD-10-CM

## 2020-02-03 DIAGNOSIS — Z7189 Other specified counseling: Secondary | ICD-10-CM | POA: Diagnosis not present

## 2020-02-03 DIAGNOSIS — F819 Developmental disorder of scholastic skills, unspecified: Secondary | ICD-10-CM

## 2020-02-03 DIAGNOSIS — F419 Anxiety disorder, unspecified: Secondary | ICD-10-CM

## 2020-02-03 DIAGNOSIS — Z818 Family history of other mental and behavioral disorders: Secondary | ICD-10-CM

## 2020-02-03 DIAGNOSIS — Z79899 Other long term (current) drug therapy: Secondary | ICD-10-CM

## 2020-02-03 DIAGNOSIS — F958 Other tic disorders: Secondary | ICD-10-CM

## 2020-02-03 DIAGNOSIS — F902 Attention-deficit hyperactivity disorder, combined type: Secondary | ICD-10-CM | POA: Insufficient documentation

## 2020-02-03 DIAGNOSIS — R251 Tremor, unspecified: Secondary | ICD-10-CM

## 2020-02-03 DIAGNOSIS — Z6841 Body Mass Index (BMI) 40.0 and over, adult: Secondary | ICD-10-CM

## 2020-02-03 DIAGNOSIS — F32A Depression, unspecified: Secondary | ICD-10-CM

## 2020-02-03 DIAGNOSIS — R278 Other lack of coordination: Secondary | ICD-10-CM

## 2020-02-03 DIAGNOSIS — R6510 Systemic inflammatory response syndrome (SIRS) of non-infectious origin without acute organ dysfunction: Secondary | ICD-10-CM | POA: Diagnosis not present

## 2020-02-03 MED ORDER — SERTRALINE HCL 20 MG/ML PO CONC: 100.0000 mg | Freq: Every day | ORAL | 2 refills | Status: DC

## 2020-02-03 NOTE — Patient Instructions (Addendum)
Restart Zoloft liquid-starting at 1 mL daily in the morning with food for at least 5-7 days, can increase by 1 mL every week up to 5 mL daily (maximum daily dose). If the dose before 5 mL is sufficient then don't have to go any higher.   Check with vocational rehabilitation for Autism Testing.  Make eye doctor appointment for routine care.

## 2020-02-03 NOTE — Progress Notes (Signed)
[facility] [address] [place] [address] Dept: [phone] Dept Fax: [phone] Loc: [phone] Loc Fax: [phone]  Medication Check  Patient ID: Henry [name], male  DOB: March 03, 2000, 20 y.o.  MRN: [id number]  Date of Evaluation: 02/03/2020 PCP: [name], Elige Radon, MD  Accompanied by: self Patient Lives with: grandmother  HISTORY/CURRENT STATUS: HPI Patient here by himself today for the visit. Patient interactive and appropriate with provider today. Patient still looking for a part-time job, had been working at [employer] but this was until he was sick. Patient has not been taking his medications but feels that he needs to restart his Zoloft due to increased anxiety.   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   MEDICAL HISTORY: Appetite: Ok but still not normal since food poisoning. No fast foods and limited grease in foods. No pork recently.  Drinking more V-8 to get vegetables in daily. MVI/Other: None    Sleep: Getting plenty of sleep, wakes about 8-9:00 am and falls asleep at 10-11:00 pm each night. Feels better with sleeping.  Concerns: Initiation/Maintenance/Other: None  Individual Medical History/ Review of Systems: Changes? :Yes pain in both knees and ankle injury. Patient with food poisoning and hospitalized for 3 days.   Allergies: Patient has no known allergies.  Current Medications: Current Outpatient Medications  Medication Instructions  . cetirizine (ZYRTEC ALLERGY) 10 mg, Oral, Daily  . diclofenac Sodium (VOLTAREN) 2 g, Topical, 4 times daily  . fluticasone (FLONASE) 50 MCG/ACT nasal spray 1 spray, Each Nare,  Daily  . ibuprofen (ADVIL) 600 mg, Oral, Every 8 hours PRN  . lisdexamfetamine (VYVANSE) 60 mg, Oral, Daily  . sertraline (ZOLOFT) 100 mg, Oral, Daily   Medication Side Effects: None  Family Medical/ Social History: Changes? None  MENTAL HEALTH: Mental Health Issues: Depression and Anxiety-not currently taking Vyvanse or Zoloft. Some increased anxiety with at night and being by himself.   PHYSICAL EXAM; Vitals:  Vitals:   02/03/20 0801  BP: 112/68  Pulse: 78  Resp: 18  Height: 5' 6.14" (1.68 m)  Weight: (!) 327 lb 3.2 oz (148.4 kg)  BMI (Calculated): 52.59   General Physical Exam: Unchanged from previous exam, date: last f/u visit Changed:None  DIAGNOSES:    ICD-10-CM   1. ADHD (attention deficit hyperactivity disorder), combined type  F90.2   2. Anxiety and depression  F41.9    F32.A   3. 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 Morbid obesity with body mass index of 50.0-59.9 in adult (HCC)  E66.01    Z68.43   5. Tremor of right hand  R25.1   6. SIRS (systemic inflammatory response syndrome) (HCC)  R65.10   7. Dysgraphia  R27.8   8. Family history of learning disability in father  Z42.8   12. Learning difficulty  F81.9   10. Medication management  Z79.899   11. Patient counseled  Z71.9   12. Goals of care, counseling/discussion  Z71.89     RECOMMENDATIONS: Counseling at this visit included the review of old records and/or current chart with the patient for work, home life, testing, vocational rehabilitation, health and medication management.   Support provided to patient regarding ASD testing through vocational rehab along with job coaching. Meeting set up  for 02/04/20 with his case manager.   Counseled patient on follow up with local store, [employer], to have evaluation for inserts for support with more walking.   Discussed recent history and today's examination with patient with no changes on exam today.   Encouraged to f/u with eye doctors for routine care and updated  exam for new corrective lenses.   Counseled regarding health and recent injuries and/or follow up appointments for continued health care.  Recommended a high protein, low sugar diet, watch portion sizes, avoid second helpings, avoid sugary snacks and drinks, drink more water, eat more fruits and vegetables, increase daily exercise.  Discussed importance of maintaining structure, routine, organization, reward, motivation and consequences with consistency with home, work situation, and social interactions.   Counseled medication pharmacokinetics, options, dosage, administration, desired effects, and possible side effects.   Zoloft 100 mg (5 mL liquid) titration up to 5 mL daily, # 500 mL with 2 RF's. RX for above e-scribed and sent to pharmacy on record  [facility] [address], [place] - [address] [place] [address] Phone: [phone] Fax: [phone]  Advised importance of:  Good sleep hygiene (8- 10 hours per night, no TV or video games for 1 hour before bedtime) Limited screen time (none on school nights, no more than 2 hours/day on weekends, use of screen time for motivation) Regular exercise(outside and active play) Healthy eating (drink water or milk, no sodas/sweet tea, limit portions and no seconds).   NEXT APPOINTMENT: Return in about 3 months (around 05/03/2020) for f/u visit. Medical Decision-making: More than 50% of the appointment was spent counseling and discussing diagnosis and management of symptoms with the patient and family.  Carron Curie, NP Counseling Time: 40 mins Total Contact Time: 45 mins

## 2020-03-10 ENCOUNTER — Telehealth: Payer: Self-pay | Admitting: Family

## 2020-03-10 NOTE — Telephone Encounter (Signed)
   Faxed records to [facility], Danielle Rankin [phone]).

## 2020-04-28 ENCOUNTER — Ambulatory Visit: Payer: Medicaid Other | Admitting: Nurse Practitioner

## 2020-04-28 ENCOUNTER — Encounter: Payer: Self-pay | Admitting: Nurse Practitioner

## 2020-04-28 VITALS — BP 137/93 | HR 83 | Temp 98.1°F

## 2020-04-28 DIAGNOSIS — J00 Acute nasopharyngitis [common cold]: Secondary | ICD-10-CM | POA: Insufficient documentation

## 2020-04-28 DIAGNOSIS — R059 Cough, unspecified: Secondary | ICD-10-CM | POA: Diagnosis not present

## 2020-04-28 DIAGNOSIS — J029 Acute pharyngitis, unspecified: Secondary | ICD-10-CM | POA: Diagnosis not present

## 2020-04-28 MED ORDER — FLUTICASONE PROPIONATE 50 MCG/ACT NA SUSP: 2.0000 | Freq: Every day | NASAL | 6 refills | Status: DC

## 2020-04-28 MED ORDER — DM-GUAIFENESIN ER 30-600 MG PO TB12: 1.0000 | ORAL_TABLET | Freq: Two times a day (BID) | ORAL | 0 refills | Status: DC

## 2020-04-28 MED ORDER — AMOXICILLIN-POT CLAVULANATE 875-125 MG PO TABS: 1.0000 | ORAL_TABLET | Freq: Two times a day (BID) | ORAL | 0 refills | Status: DC

## 2020-04-28 NOTE — Patient Instructions (Addendum)
Pharyngitis  Pharyngitis is a sore throat (pharynx). This is when there is redness, pain, and swelling in your throat. Most of the time, this condition gets better on its own. In some cases, you may need medicine. Follow these instructions at home:  Take over-the-counter and prescription medicines only as told by your doctor. ? If you were prescribed an antibiotic medicine, take it as told by your doctor. Do not stop taking the antibiotic even if you start to feel better. ? Do not give children aspirin. Aspirin has been linked to Reye syndrome.  Drink enough water and fluids to keep your pee (urine) clear or pale yellow.  Get a lot of rest.  Rinse your mouth (gargle) with a salt-water mixture 3-4 times a day or as needed. To make a salt-water mixture, completely dissolve -1 tsp of salt in 1 cup of warm water.  If your doctor approves, you may use throat lozenges or sprays to soothe your throat. Contact a doctor if:  You have large, tender lumps in your neck.  You have a rash.  You cough up green, yellow-brown, or bloody spit. Get help right away if:  You have a stiff neck.  You drool or cannot swallow liquids.  You cannot drink or take medicines without throwing up.  You have very bad pain that does not go away with medicine.  You have problems breathing, and it is not from a stuffy nose.  You have new pain and swelling in your knees, ankles, wrists, or elbows. Summary  Pharyngitis is a sore throat (pharynx). This is when there is redness, pain, and swelling in your throat.  If you were prescribed an antibiotic medicine, take it as told by your doctor. Do not stop taking the antibiotic even if you start to feel better.  Most of the time, pharyngitis gets better on its own. Sometimes, you may need medicine. This information is not intended to replace advice given to you by your health care provider. Make sure you discuss any questions you have with your health care  provider. Document Revised: 12/22/2016 Document Reviewed: 02/15/2016 Elsevier Patient Education  2021 Elsevier Inc.  

## 2020-04-28 NOTE — Progress Notes (Signed)
Acute Office Visit  Subjective:    Patient ID: Henry [name], male    DOB: Jun 01, 2000, 20 y.o.   MRN: [id number]  Chief Complaint  Patient presents with  . Cough  . Headache    Cough This is a new problem. The current episode started in the past 7 days. The problem has been unchanged. The problem occurs constantly. The cough is productive of sputum. Associated symptoms include chills, headaches, nasal congestion and a sore throat. Pertinent negatives include no chest pain or shortness of breath. Nothing aggravates the symptoms.  Headache  Associated symptoms include coughing and a sore throat.  Sore Throat  The current episode started in the past 7 days. The problem has been unchanged. Neither side of throat is experiencing more pain than the other. There has been no fever. The pain is moderate. Associated symptoms include coughing and headaches. Pertinent negatives include no shortness of breath. He has had no exposure to strep or mono. He has tried nothing for the symptoms.     Past Medical History:  Diagnosis Date  . Acne   . ADHD (attention deficit hyperactivity disorder)   . Anxiety   . Depression     History reviewed. No pertinent surgical history.  Family History  Problem Relation Age of Onset  . Thyroid disease Mother   . Diabetes Other   . Heart disease Other     Social History   Socioeconomic History  . Marital status: Single    Spouse name: Not on file  . Number of children: Not on file  . Years of education: Not on file  . Highest education level: Not on file  Occupational History  . Not on file  Tobacco Use  . Smoking status: Passive Smoke Exposure - Never Smoker  . Smokeless tobacco: Never Used  Vaping Use  . Vaping Use: Never used  Substance and Sexual Activity  . Alcohol use: No    Alcohol/week: 0.0 standard drinks  . Drug use: No  . Sexual activity: Never  Other Topics Concern  . 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 He lives with both parents and he has 1 sister. He enjoys playing video games and he loves history class   Social Determinants of Corporate investment banker Strain: Not on file  Food Insecurity: Not on file  Transportation Needs: Not on file  Physical Activity: Not on file  Stress: Not on file  Social Connections: Not on file  Intimate Partner Violence: Not on file    Outpatient Medications Prior to Visit  Medication Sig Dispense Refill  . cetirizine (ZYRTEC ALLERGY) 10 MG tablet Take 1 tablet (10 mg total) by mouth daily. 30 tablet 0  . diclofenac Sodium (VOLTAREN) 1 % GEL Apply 2 g topically 4 (four) times daily. 50 g 3  . fluticasone (FLONASE) 50 MCG/ACT nasal spray Place 1 spray into both nostrils daily for 14 days. (Patient not taking: Reported on 12/09/2019) 16 g 0  . ibuprofen (ADVIL) 600 MG tablet Take 1 tablet (600 mg total) by mouth every 8 (eight) hours as needed. (Patient not taking: Reported on 02/03/2020) 30 tablet 0  . lisdexamfetamine (VYVANSE) 60 MG capsule Take 1 capsule (60 mg total) by mouth daily. (Patient not taking: Reported on 12/09/2019) 30 capsule 0  . sertraline (ZOLOFT) 20 MG/ML concentrated solution Take 5 mLs (100 mg total) by mouth daily. 500 mL 2  No facility-administered medications prior to visit.    No Known Allergies  Review of Systems  Constitutional: Positive for chills.  HENT: Positive for sore throat.   Respiratory: Positive for cough. Negative for shortness of breath.   Cardiovascular: Negative for chest pain.  Gastrointestinal: Negative.   Musculoskeletal: Negative.   Neurological: Positive for headaches.  All other systems reviewed and are negative.      Objective:    Physical Exam Vitals reviewed.  Constitutional:      Appearance: He is well-developed.  HENT:     Head: Normocephalic.     Nose: Nose normal.     Mouth/Throat:     Pharynx: Oropharyngeal exudate present. No posterior  oropharyngeal erythema.  Eyes:     Conjunctiva/sclera: Conjunctivae normal.  Cardiovascular:     Rate and Rhythm: Normal rate and regular rhythm.  Pulmonary:     Breath sounds: Normal breath sounds.     Comments: Cough Abdominal:     General: Bowel sounds are normal.  Skin:    General: Skin is warm.  Neurological:     Mental Status: He is alert and oriented to person, place, and time.     BP (!) 137/93   Pulse 83   Temp 98.1 F (36.7 C) (Temporal)   SpO2 94%  Wt Readings from Last 3 Encounters:  12/09/19 (!) 327 lb (148.3 kg)  11/24/19 (!) 330 lb (149.7 kg)  11/06/19 (!) 326 lb 9.6 oz (148.1 kg)    Health Maintenance Due  Topic Date Due  . Hepatitis C Screening  Never done  . HPV VACCINES (1 - Male 2-dose series) Never done  . COVID-19 Vaccine (3 - Booster for Moderna series) 01/07/2020       Topic Date Due  . HPV VACCINES (1 - Male 2-dose series) Never done     Lab Results  Component Value Date   TSH 6.890 (H) 07/04/2016   Lab Results  Component Value Date   WBC 4.9 05/12/2019   HGB 12.7 (L) 05/12/2019   HCT 39.3 05/12/2019   MCV 90.6 05/12/2019   PLT 169 05/12/2019   Lab Results  Component Value Date   NA 142 05/13/2019   K 3.6 05/13/2019   CO2 25 05/13/2019   GLUCOSE 89 05/13/2019   BUN 7 05/13/2019   CREATININE 0.67 05/13/2019   BILITOT 1.1 05/12/2019   ALKPHOS 44 05/12/2019   AST 18 05/12/2019   ALT 24 05/12/2019   PROT 5.7 (L) 05/12/2019   ALBUMIN 3.2 (L) 05/12/2019   CALCIUM 8.6 (L) 05/13/2019   ANIONGAP 9 05/13/2019   Lab Results  Component Value Date   CHOL 192 (H) 07/04/2016   Lab Results  Component Value Date   HDL 34 (L) 07/04/2016   Lab Results  Component Value Date   LDLCALC 119 (H) 07/04/2016   Lab Results  Component Value Date   TRIG 193 (H) 07/04/2016   Lab Results  Component Value Date   CHOLHDL 5.6 (H) 07/04/2016   No results found for: HGBA1C     Assessment & Plan:   Problem List Items Addressed This  Visit      Respiratory   Pharyngitis - Primary    Symptoms not well controlled in the past few days.  Patient is reporting worsening cough, congestion and chills.  Completed COVID-19 test with results pending.  Augmentin, Flonase and guaifenesin for cough and congestion sent to pharmacy.  Follow-up with worsening unresolved symptoms.      Relevant  Medications   amoxicillin-clavulanate (AUGMENTIN) 875-125 MG tablet   fluticasone (FLONASE) 50 MCG/ACT nasal spray     Other   Cough    Guaifenesin for cough and congestion every 12 hours, follow-up with worsening unresolved symptoms.      Relevant Medications   dextromethorphan-guaiFENesin (MUCINEX DM) 30-600 MG 12hr tablet   Other Relevant Orders   Novel Coronavirus, NAA ([facility])       Meds ordered this encounter  Medications  . amoxicillin-clavulanate (AUGMENTIN) 875-125 MG tablet    Sig: Take 1 tablet by mouth 2 (two) times daily.    Dispense:  14 tablet    Refill:  0    Order Specific Question:   Supervising Provider    Answer:   Raliegh Ip [[id number]]  . fluticasone (FLONASE) 50 MCG/ACT nasal spray    Sig: Place 2 sprays into both nostrils daily.    Dispense:  16 g    Refill:  6    Order Specific Question:   Supervising Provider    Answer:   Raliegh Ip [[id number]]  . dextromethorphan-guaiFENesin (MUCINEX DM) 30-600 MG 12hr tablet    Sig: Take 1 tablet by mouth 2 (two) times daily.    Dispense:  30 tablet    Refill:  0    Order Specific Question:   Supervising Provider    Answer:   Raliegh Ip [[id number]]     Daryll Drown, NP

## 2020-04-28 NOTE — Assessment & Plan Note (Signed)
Guaifenesin for cough and congestion every 12 hours, follow-up with worsening unresolved symptoms.

## 2020-04-28 NOTE — Assessment & Plan Note (Signed)
Symptoms not well controlled in the past few days.  Patient is reporting worsening cough, congestion and chills.  Completed COVID-19 test with results pending.  Augmentin, Flonase and guaifenesin for cough and congestion sent to pharmacy.  Follow-up with worsening unresolved symptoms.

## 2020-04-29 LAB — NOVEL CORONAVIRUS, NAA: SARS-CoV-2, NAA: NOT DETECTED

## 2020-04-29 LAB — SARS-COV-2, NAA 2 DAY TAT

## 2020-05-18 ENCOUNTER — Encounter: Payer: Medicaid Other | Admitting: Family

## 2020-05-26 ENCOUNTER — Ambulatory Visit: Payer: Medicaid Other | Admitting: Nurse Practitioner

## 2020-05-26 ENCOUNTER — Other Ambulatory Visit: Payer: Self-pay

## 2020-05-26 ENCOUNTER — Encounter: Payer: Self-pay | Admitting: Nurse Practitioner

## 2020-05-26 VITALS — BP 134/89 | HR 89 | Temp 97.5°F | Ht 66.0 in | Wt 328.0 lb

## 2020-05-26 DIAGNOSIS — M549 Dorsalgia, unspecified: Secondary | ICD-10-CM | POA: Insufficient documentation

## 2020-05-26 MED ORDER — IBUPROFEN 600 MG PO TABS: 600.0000 mg | ORAL_TABLET | Freq: Three times a day (TID) | ORAL | 0 refills | Status: DC | PRN

## 2020-05-26 NOTE — Progress Notes (Signed)
Acute Office Visit  Subjective:    Patient ID: Henry [name], male    DOB: 04-10-2000, 20 y.o.   MRN: [id number]  Chief Complaint  Patient presents with  . Back Pain    HPI Patient is in today for Pain  He reports new onset right upper back pain. was an injury that may have caused the pain. The pain started about a week ago and is staying constant. The pain does radiate right front chest. The pain is described as aching, is moderate in intensity, occurring intermittently. Symptoms are worse in the: all day  Aggravating factors: none Relieving factors: none.  He has tried nothing for symptoms      Past Medical History:  Diagnosis Date  . Acne   . ADHD (attention deficit hyperactivity disorder)   . Anxiety   . Depression     History reviewed. No pertinent surgical history.  Family History  Problem Relation Age of Onset  . Thyroid disease Mother   . Diabetes Other   . Heart disease Other     Social History   Socioeconomic History  . Marital status: Single    Spouse name: Not on file  . Number of children: Not on file  . Years of education: Not on file  . Highest education level: Not on file  Occupational History  . Not on file  Tobacco Use  . Smoking status: Passive Smoke Exposure - Never Smoker  . Smokeless tobacco: Never Used  Vaping Use  . Vaping Use: Never used  Substance and Sexual Activity  . Alcohol use: No    Alcohol/week: 0.0 standard drinks  . Drug use: No  . Sexual activity: Never  Other Topics Concern  . 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 He lives with both parents and he has 1 sister. He enjoys playing video games and he loves history class   Social Determinants of Corporate investment banker Strain: Not on file  Food Insecurity: Not on file  Transportation Needs: Not on file  Physical Activity: Not on file  Stress: Not on file  Social Connections: Not on file  Intimate  Partner Violence: Not on file    Outpatient Medications Prior to Visit  Medication Sig Dispense Refill  . amoxicillin-clavulanate (AUGMENTIN) 875-125 MG tablet Take 1 tablet by mouth 2 (two) times daily. 14 tablet 0  . dextromethorphan-guaiFENesin (MUCINEX DM) 30-600 MG 12hr tablet Take 1 tablet by mouth 2 (two) times daily. 30 tablet 0  . fluticasone (FLONASE) 50 MCG/ACT nasal spray Place 2 sprays into both nostrils daily. 16 g 6   No facility-administered medications prior to visit.    No Known Allergies  Review of Systems  Constitutional: Negative.   HENT: Negative.   Eyes: Negative.   Respiratory: Negative.   Cardiovascular: Negative.   Gastrointestinal: Negative.   Genitourinary: Negative.   Musculoskeletal: Positive for back pain.  Skin: Negative for rash.  All other systems reviewed and are negative.      Objective:    Physical Exam Vitals reviewed.  Constitutional:      Appearance: Normal appearance.  HENT:     Head: Normocephalic.     Nose: Nose normal.     Mouth/Throat:     Mouth: Mucous membranes are moist.  Eyes:     Conjunctiva/sclera: Conjunctivae normal.  Cardiovascular:     Rate and Rhythm: Normal rate and regular rhythm.  Pulses: Normal pulses.     Heart sounds: Normal heart sounds.  Pulmonary:     Effort: Pulmonary effort is normal.     Breath sounds: Normal breath sounds.  Abdominal:     General: Bowel sounds are normal.  Musculoskeletal:     Cervical back: Tenderness present. No swelling or erythema. Pain with movement present.     Thoracic back: Tenderness present.       Back:     Comments: Pain with ROM   Skin:    Findings: No rash.  Neurological:     Mental Status: He is alert and oriented to person, place, and time.     BP 134/89   Pulse 89   Temp (!) 97.5 F (36.4 C) (Temporal)   Ht 5\' 6"  (1.676 m)   Wt (!) 328 lb (148.8 kg)   SpO2 94%   BMI 52.94 kg/m  Wt Readings from Last 3 Encounters:  05/26/20 (!) 328 lb (148.8  kg)  12/09/19 (!) 327 lb (148.3 kg)  11/24/19 (!) 330 lb (149.7 kg)    Health Maintenance Due  Topic Date Due  . Hepatitis C Screening  Never done  . HPV VACCINES (1 - Male 2-dose series) Never done  . COVID-19 Vaccine (3 - Booster for Moderna series) 01/07/2020       Topic Date Due  . HPV VACCINES (1 - Male 2-dose series) Never done     Lab Results  Component Value Date   TSH 6.890 (H) 07/04/2016   Lab Results  Component Value Date   WBC 4.9 05/12/2019   HGB 12.7 (L) 05/12/2019   HCT 39.3 05/12/2019   MCV 90.6 05/12/2019   PLT 169 05/12/2019   Lab Results  Component Value Date   NA 142 05/13/2019   K 3.6 05/13/2019   CO2 25 05/13/2019   GLUCOSE 89 05/13/2019   BUN 7 05/13/2019   CREATININE 0.67 05/13/2019   BILITOT 1.1 05/12/2019   ALKPHOS 44 05/12/2019   AST 18 05/12/2019   ALT 24 05/12/2019   PROT 5.7 (L) 05/12/2019   ALBUMIN 3.2 (L) 05/12/2019   CALCIUM 8.6 (L) 05/13/2019   ANIONGAP 9 05/13/2019   Lab Results  Component Value Date   CHOL 192 (H) 07/04/2016   Lab Results  Component Value Date   HDL 34 (L) 07/04/2016   Lab Results  Component Value Date   LDLCALC 119 (H) 07/04/2016   Lab Results  Component Value Date   TRIG 193 (H) 07/04/2016   Lab Results  Component Value Date   CHOLHDL 5.6 (H) 07/04/2016   No results found for: HGBA1C     Assessment & Plan:   Problem List Items Addressed This Visit      Other   Acute upper back pain - Primary    New back pain in the last week and a half.  Patient reports slipping in his bed with a dog and, and reports dog kicking him ultra the night and could not get comfortable. Started patient on ibuprofen 600 mg tablet by mouth every 8 hours as needed for pain.  Follow-up with worsening unresolved symptoms.  Medication sent to pharmacy.      Relevant Medications   ibuprofen (ADVIL) 600 MG tablet       Meds ordered this encounter  Medications  . ibuprofen (ADVIL) 600 MG tablet    Sig: Take  1 tablet (600 mg total) by mouth every 8 (eight) hours as needed.    Dispense:  30 tablet    Refill:  0    Order Specific Question:   Supervising Provider    Answer:   Raliegh Ip [[id number]]     Daryll Drown, NP

## 2020-05-26 NOTE — Patient Instructions (Signed)
Acute Back Pain, Adult Acute back pain is sudden and usually short-lived. It is often caused by an injury to the muscles and tissues in the back. The injury may result from:  A muscle or ligament getting overstretched or torn (strained). Ligaments are tissues that connect bones to each other. Lifting something improperly can cause a back strain.  Wear and tear (degeneration) of the spinal disks. Spinal disks are circular tissue that provide cushioning between the bones of the spine (vertebrae).  Twisting motions, such as while playing sports or doing yard work.  A hit to the back.  Arthritis. You may have a physical exam, lab tests, and imaging tests to find the cause of your pain. Acute back pain usually goes away with rest and home care. Follow these instructions at home: Managing pain, stiffness, and swelling  Treatment may include medicines for pain and inflammation that are taken by mouth or applied to the skin, prescription pain medicine, or muscle relaxants. Take over-the-counter and prescription medicines only as told by your health care provider.  Your health care provider may recommend applying ice during the first 24-48 hours after your pain starts. To do this: ? Put ice in a plastic bag. ? Place a towel between your skin and the bag. ? Leave the ice on for 20 minutes, 2-3 times a day.  If directed, apply heat to the affected area as often as told by your health care provider. Use the heat source that your health care provider recommends, such as a moist heat pack or a heating pad. ? Place a towel between your skin and the heat source. ? Leave the heat on for 20-30 minutes. ? Remove the heat if your skin turns bright red. This is especially important if you are unable to feel pain, heat, or cold. You have a greater risk of getting burned. Activity  Do not stay in bed. Staying in bed for more than 1-2 days can delay your recovery.  Sit up and stand up straight. Avoid leaning  forward when you sit or hunching over when you stand. ? If you work at a desk, sit close to it so you do not need to lean over. Keep your chin tucked in. Keep your neck drawn back, and keep your elbows bent at a 90-degree angle (right angle). ? Sit high and close to the steering wheel when you drive. Add lower back (lumbar) support to your car seat, if needed.  Take short walks on even surfaces as soon as you are able. Try to increase the length of time you walk each day.  Do not sit, drive, or stand in one place for more than 30 minutes at a time. Sitting or standing for long periods of time can put stress on your back.  Do not drive or use heavy machinery while taking prescription pain medicine.  Use proper lifting techniques. When you bend and lift, use positions that put less stress on your back: ? Bend your knees. ? Keep the load close to your body. ? Avoid twisting.  Exercise regularly as told by your health care provider. Exercising helps your back heal faster and helps prevent back injuries by keeping muscles strong and flexible.  Work with a physical therapist to make a safe exercise program, as recommended by your health care provider. Do any exercises as told by your physical therapist.   Lifestyle  Maintain a healthy weight. Extra weight puts stress on your back and makes it difficult to have   good posture.  Avoid activities or situations that make you feel anxious or stressed. Stress and anxiety increase muscle tension and can make back pain worse. Learn ways to manage anxiety and stress, such as through exercise. General instructions  Sleep on a firm mattress in a comfortable position. Try lying on your side with your knees slightly bent. If you lie on your back, put a pillow under your knees.  Follow your treatment plan as told by your health care provider. This may include: ? Cognitive or behavioral therapy. ? Acupuncture or massage therapy. ? Meditation or yoga. Contact  a health care provider if:  You have pain that is not relieved with rest or medicine.  You have increasing pain going down into your legs or buttocks.  Your pain does not improve after 2 weeks.  You have pain at night.  You lose weight without trying.  You have a fever or chills. Get help right away if:  You develop new bowel or bladder control problems.  You have unusual weakness or numbness in your arms or legs.  You develop nausea or vomiting.  You develop abdominal pain.  You feel faint. Summary  Acute back pain is sudden and usually short-lived.  Use proper lifting techniques. When you bend and lift, use positions that put less stress on your back.  Take over-the-counter and prescription medicines and apply heat or ice as directed by your health care provider. This information is not intended to replace advice given to you by your health care provider. Make sure you discuss any questions you have with your health care provider. Document Revised: 10/03/2019 Document Reviewed: 10/03/2019 Elsevier Patient Education  2021 Elsevier Inc.  

## 2020-05-26 NOTE — Assessment & Plan Note (Signed)
New back pain in the last week and a half.  Patient reports slipping in his bed with a dog and, and reports dog kicking him ultra the night and could not get comfortable. Started patient on ibuprofen 600 mg tablet by mouth every 8 hours as needed for pain.  Follow-up with worsening unresolved symptoms.  Medication sent to pharmacy.

## 2020-05-27 ENCOUNTER — Encounter: Payer: Self-pay | Admitting: Family

## 2020-05-27 ENCOUNTER — Telehealth (INDEPENDENT_AMBULATORY_CARE_PROVIDER_SITE_OTHER): Payer: Medicaid Other | Admitting: Family

## 2020-05-27 DIAGNOSIS — Z6841 Body Mass Index (BMI) 40.0 and over, adult: Secondary | ICD-10-CM | POA: Diagnosis not present

## 2020-05-27 DIAGNOSIS — Z818 Family history of other mental and behavioral disorders: Secondary | ICD-10-CM | POA: Diagnosis not present

## 2020-05-27 DIAGNOSIS — R278 Other lack of coordination: Secondary | ICD-10-CM | POA: Diagnosis not present

## 2020-05-27 DIAGNOSIS — F819 Developmental disorder of scholastic skills, unspecified: Secondary | ICD-10-CM

## 2020-05-27 DIAGNOSIS — F902 Attention-deficit hyperactivity disorder, combined type: Secondary | ICD-10-CM

## 2020-05-27 DIAGNOSIS — F339 Major depressive disorder, recurrent, unspecified: Secondary | ICD-10-CM | POA: Diagnosis not present

## 2020-05-27 DIAGNOSIS — Z79899 Other long term (current) drug therapy: Secondary | ICD-10-CM | POA: Diagnosis not present

## 2020-05-27 DIAGNOSIS — Z713 Dietary counseling and surveillance: Secondary | ICD-10-CM

## 2020-05-27 DIAGNOSIS — Z7189 Other specified counseling: Secondary | ICD-10-CM | POA: Diagnosis not present

## 2020-05-27 DIAGNOSIS — F411 Generalized anxiety disorder: Secondary | ICD-10-CM | POA: Diagnosis not present

## 2020-05-27 DIAGNOSIS — Z719 Counseling, unspecified: Secondary | ICD-10-CM

## 2020-05-27 MED ORDER — LISDEXAMFETAMINE DIMESYLATE 60 MG PO CAPS: 60.0000 mg | ORAL_CAPSULE | Freq: Every day | ORAL | 0 refills | Status: DC

## 2020-05-27 NOTE — Progress Notes (Addendum)
[facility] [address], [place] [place] [address] Dept: [phone] Dept Fax: [phone]  Medication Check visit via Virtual Video   Patient ID:  Henry [name]  male DOB: [birth year]   20 y.o.   MRN: [id number]   DATE:05/27/20  PCP: [name], Elige Radon, MD  Virtual Visit via Video Note  I connected with  Henry [name] on 05/31/20 at  1:00 PM EDT by a video enabled telemedicine application and verified that I am speaking with the correct person using two identifiers. Patient/Parent Location: at home   I discussed the limitations, risks, security and privacy concerns of performing an evaluation and management service by telephone and the availability of in person appointments. I also discussed with the parents that there may be a patient responsible charge related to this service. The parents expressed understanding and agreed to proceed.  Provider: Carron Curie, NP  Location: at work  HPI/CURRENT STATUS: Henry [name] is here for medication management of the psychoactive medications for ADHD and review of educational and behavioral concerns.   Henry [name] currently taking Vyvanse on occasion, which is working well on the days he takes the medication. Takes medication in the morning. Medication tends to wear off around evening time. Helps him to focus for work and performing chores at home.  Henry [name] is eating well (eating breakfast, lunch and dinner). Eating ok and drinking more V-fusions with juice blends. Cutting back on sodium and not drinking soda's. Now including more water and Green Tea.   Sleeping well (getting about 4-5 hours total during the night right now), sleeping through the night. Getting better sleep now, more hours now. Helping grandmother to move during the night. Napping during the day when he can.   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   Screen time: (phone, tablet, TV, computer): computer, TV, phone and games.   MEDICAL HISTORY: Individual Medical History/ Review of Systems: PCP yesterday for regular care. Having pain in his chest and back on the right side.   Family Medical/ Social History: Changes? Yes, PGM with surgery on her knee on Friday. Grandfather had heart surgery last month and doing well Patient Lives with: grandmother  MENTAL HEALTH: Mental Health Issues:   Depression and Anxiety-doing well with symptoms. Slightly stressed about GM's recent surgery on her knee and in pain.    Allergies: No Known Allergies  Current Medications:  Current Outpatient Medications  Medication Instructions  . ibuprofen (ADVIL) 600 mg, Oral, Every 8 hours PRN  . lisdexamfetamine (VYVANSE) 60 mg, Oral, Daily   Medication Side Effects: None  DIAGNOSES:    ICD-10-CM   1. ADHD (attention deficit hyperactivity disorder), combined type  F90.2   2. Depression, recurrent (HCC)  F33.9   3. Generalized anxiety disorder  F41.1   4. Learning difficulty  F81.9   5. Family history of learning disability in father  Z31.8   6. Dysgraphia  R27.8   7. Morbid obesity with body mass index of 50.0-59.9 in adult (HCC)  E66.01    Z68.43   8. Medication management  Z79.899   9. Patient counseled  Z71.9   10. Goals of care, counseling/discussion  Z71.89   11. Dietary counseling  Z71.3  ASSESSMENT:  Patient doing well with working and has adjusted well to his work duties along with his schedule. 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 Needing some support with interaction with customers and learning better people skills. 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 Recent PCP visit due to complaints of pain and discomfort with OTC Rx treatment. Will f/u if pain continues and may need referral. Sleeping better before helping grandparents, but now up during the night to help grandmother to get to the BR. Eating with better choices and cutting out sugars along with moving more at work. Only taking his Vyvanse on work days, but recently ran out of medication. Doing well on the Vyvanse and not taking his Zoloft at this time. Henry [name] reports less anxiety and feels he doesn't need the Zoloft. To continue with Vyvanse with daily dosing encouraged for consistency with symptoms.   PLAN/RECOMMENDATIONS:  Patient provided updates with health, PCP visit, work, schedule, interaction, and medications.  Patient has vocational Educational psychologist to assist with job search and now working 3 days each week. Vocational rehab coach assist with work dueites and checking in on performance. Assisting with communication and social skills needed for customer interactions. Continue with needed support.   Health care updates with PCP recently discussed at length. See PCP for recent pain and discomfort with chest area into the back on the right side. Only provided OTC medications for treatment. Discussed pain and location along with onset. To revisit PCP or get referral for specialist is pain continues or persists.   Recent changes with weight loss and dietary changes reviewed. Patient wanting to lose weight and has made an effort to change his dietary intake of daily sugars. Looking to limit soda and other high sugar content with drinks and limitations placed on carbohydrates each day. This along with more water and daily activity will assist with weight loss. Henry [name] wanting to lose weight to stay healthy to assist his grandparent with their healthcare needs.   May consider counseling to assist with emotional dysregulation and ADHD coping skills.  Patient denies the need for treatment of his anxiety and/or depression at this time. May consider online counseling.   Sleep hygiene discussed with patient and need for establishing a bedtime routine. 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  Support and encouragement give given.   Counseled medication pharmacokinetics, options, dosage, administration, desired effects, and possible side effects.   Vyvanse to restart at 60 mg daily, # 30 with no RF's RX for above e-scribed and sent to pharmacy on record  [facility] [address], [place] - [address] [place] [address] Phone: [phone] Fax: [phone]   I discussed the assessment and treatment plan with the patient. The patient was provided an opportunity to ask questions and all were answered. The patient  agreed with the plan and demonstrated an understanding of the instructions.   I provided 40 minutes of non-face-to-face time during this encounter. Completed record review for 10 minutes prior to the virtual video visit.   NEXT APPOINTMENT:  08/17/2020  Return in about 3 months (around 08/27/2020) for f/u visit.  The patient was advised to call back or seek an in-person evaluation if the symptoms worsen or if  the condition fails to improve as anticipated.   Carron Curie, NP

## 2020-05-31 ENCOUNTER — Encounter: Payer: Self-pay | Admitting: Family

## 2020-06-04 ENCOUNTER — Ambulatory Visit: Payer: Medicaid Other | Admitting: Nurse Practitioner

## 2020-06-04 ENCOUNTER — Other Ambulatory Visit: Payer: Self-pay

## 2020-06-04 ENCOUNTER — Encounter: Payer: Self-pay | Admitting: Nurse Practitioner

## 2020-06-04 VITALS — BP 113/67 | HR 72 | Temp 97.8°F | Resp 20 | Ht 66.0 in | Wt 334.0 lb

## 2020-06-04 DIAGNOSIS — L247 Irritant contact dermatitis due to plants, except food: Secondary | ICD-10-CM | POA: Diagnosis not present

## 2020-06-04 MED ORDER — PREDNISONE 20 MG PO TABS: 40.0000 mg | ORAL_TABLET | Freq: Every day | ORAL | 0 refills | Status: AC

## 2020-06-04 NOTE — Progress Notes (Signed)
   Subjective:    Patient ID: Henry [name], male    DOB: Sep 09, 2000, 20 y.o.   MRN: [id number]   Chief Complaint: Poison oak on legs and arms   HPI Patient comes in today c/o rash. 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 Has it on both forearms and left lower leg.    Review of Systems  Constitutional: Negative for diaphoresis.  Eyes: Negative for pain.  Respiratory: Negative for shortness of breath.   Cardiovascular: Negative for chest pain, palpitations and leg swelling.  Gastrointestinal: Negative for abdominal pain.  Endocrine: Negative for polydipsia.  Skin: Negative for rash.  Neurological: Negative for dizziness, weakness and headaches.  Hematological: Does not bruise/bleed easily.  All other systems reviewed and are negative.      Objective:   Physical Exam Vitals and nursing note reviewed.  Constitutional:      Appearance: Normal appearance.  Cardiovascular:     Rate and Rhythm: Normal rate and regular rhythm.     Heart sounds: Normal heart sounds.  Pulmonary:     Breath sounds: Normal breath sounds.  Skin:    General: Skin is warm.     Comments: Vesicular lesions inlinear pattern on left forearm and left lower leg.  Neurological:     General: No focal deficit present.     Mental Status: He is alert and oriented to person, place, and time.  Psychiatric:        Mood and Affect: Mood normal.        Behavior: Behavior normal.    BP 113/67   Pulse 72   Temp 97.8 F (36.6 C) (Temporal)   Resp 20   Ht 5\' 6"  (1.676 m)   Wt (!) 334 lb (151.5 kg)   SpO2 97%   BMI 53.91 kg/m         Assessment & Plan:  Fawzi Melman in today with chief complaint of Poison oak on legs and arms   1. Irritant contact dermatitis due to plants, except food Cool compresses Avoid scratching RTO prn - predniSONE (DELTASONE) 20 MG tablet; Take 2 tablets (40 mg total) by mouth daily with breakfast for 5 days. 2 po daily for 5  days  Dispense: 10 tablet; Refill: 0    The above assessment and management plan was discussed with the patient. The patient verbalized understanding of and has agreed to the management plan. Patient is aware to call the clinic if symptoms persist or worsen. Patient is aware when to return to the clinic for a follow-up visit. Patient educated on when it is appropriate to go to the emergency department.   Mary-Margaret [name], FNP

## 2020-06-04 NOTE — Patient Instructions (Signed)
Poison Ivy Dermatitis Poison ivy dermatitis is redness and soreness of the skin caused by chemicals in the leaves of the poison ivy plant. You may have very bad itching, swelling, a rash, and blisters. What are the causes?  Touching a poison ivy plant.  Touching something that has the chemical on it. This may include animals or objects that have come in contact with the plant. What increases the risk?  Going outdoors often in wooded or marshy areas.  Going outdoors without wearing protective clothing, such as closed shoes, long pants, and a long-sleeved shirt. What are the signs or symptoms?  Skin redness.  Very bad itching.  A rash that often includes bumps and blisters. ? The rash usually appears 48 hours after exposure, if you have been exposed before. ? If this is the first time you have been exposed, the rash may not appear until a week after exposure.  Swelling. This may occur if the reaction is very bad. Symptoms usually last for 1-2 weeks. The first time you develop this condition, symptoms may last 3-4 weeks.   How is this treated? This condition may be treated with:  Hydrocortisone cream or calamine lotion to relieve itching.  Oatmeal baths to soothe the skin.  Medicines, such as over-the-counter antihistamine tablets.  Oral steroid medicine for more severe reactions. Follow these instructions at home: Medicines  Take or apply over-the-counter and prescription medicines only as told by your doctor.  Use hydrocortisone cream or calamine lotion as needed to help with itching. General instructions  Do not scratch or rub your skin.  Put a cold, wet cloth (cold compress) on the affected areas or take baths in cool water. This will help with itching.  Avoid hot baths and showers.  Take oatmeal baths as needed. Use colloidal oatmeal. You can get this at a pharmacy or grocery store. Follow the instructions on the package.  While you have the rash, wash your clothes  right after you wear them.  Keep all follow-up visits as told by your health care provider. This is important. How is this prevented?  Know what poison ivy looks like, so you can avoid it. ? This plant has three leaves with flowering branches on a single stem. ? The leaves are glossy. ? The leaves have uneven edges that come to a point at the front.  If you touch poison ivy, wash your skin with soap and water right away. Be sure to wash under your fingernails.  When hiking or camping, wear long pants, a long-sleeved shirt, tall socks, and hiking boots. You can also use a lotion on your skin that helps to prevent contact with poison ivy.  If you think that your clothes or outdoor gear came in contact with poison ivy, rinse them off with a garden hose before you bring them inside your house.  When doing yard work or gardening, wear gloves, long sleeves, long pants, and boots. Wash your garden tools and gloves if they come in contact with poison ivy.  If you think that your pet has come into contact with poison ivy, wash him or her with pet shampoo and water. Make sure to wear gloves while washing your pet.   Contact a doctor if:  You have open sores in the rash area.  You have more redness, swelling, or pain in the rash area.  You have redness that spreads beyond the rash area.  You have fluid, blood, or pus coming from the rash area.  You   have a fever.  You have a rash over a large area of your body.  You have a rash on your eyes, mouth, or genitals.  Your rash does not get better after a few weeks. Get help right away if:  Your face swells or your eyes swell shut.  You have trouble breathing.  You have trouble swallowing. These symptoms may be an emergency. Do not wait to see if the symptoms will go away. Get medical help right away. Call your local emergency services (911 in the [place] Do not drive yourself to the hospital. Summary  Poison ivy dermatitis is redness and  soreness of the skin caused by chemicals in the leaves of the poison ivy plant.  You may have skin redness, very bad itching, swelling, and a rash.  Do not scratch or rub your skin.  Take or apply over-the-counter and prescription medicines only as told by your doctor. This information is not intended to replace advice given to you by your health care provider. Make sure you discuss any questions you have with your health care provider. Document Revised: 05/03/2018 Document Reviewed: 01/04/2018 Elsevier Patient Education  2021 Elsevier Inc.  

## 2020-07-05 ENCOUNTER — Ambulatory Visit: Payer: Medicaid Other | Admitting: Family

## 2020-07-05 ENCOUNTER — Encounter: Payer: Self-pay | Admitting: Family

## 2020-07-05 DIAGNOSIS — J029 Acute pharyngitis, unspecified: Secondary | ICD-10-CM

## 2020-07-05 MED ORDER — AMOXICILLIN 500 MG PO CAPS: 500.0000 mg | ORAL_CAPSULE | Freq: Two times a day (BID) | ORAL | 0 refills | Status: AC

## 2020-07-05 NOTE — Progress Notes (Signed)
   Virtual Visit  Note Due to COVID-19 pandemic this visit was conducted virtually. This visit type was conducted due to national recommendations for restrictions regarding the COVID-19 Pandemic (e.g. social distancing, sheltering in place) in an effort to limit this patient's exposure and mitigate transmission in our community. All issues noted in this document were discussed and addressed.  A physical exam was not performed with this format.  I connected with Cobain Morici on 07/05/20 at 1:56 pm  by telephone and verified that I am speaking with the correct person using two identifiers. Zaryan Yakubov is currently located at car and grandmother is currently with him  during visit. The provider, Jannifer Rodney, FNP is located in their office at time of visit.  I discussed the limitations, risks, security and privacy concerns of performing an evaluation and management service by telephone and the availability of in person appointments. I also discussed with the patient that there may be a patient responsible charge related to this service. The patient expressed understanding and agreed to proceed.   History and Present Illness:  Sore Throat  This is a new problem. The current episode started 1 to 4 weeks ago. The problem has been gradually worsening. There has been no fever. The pain is at a severity of 7/10. Associated symptoms include congestion, coughing, a plugged ear sensation, swollen glands and trouble swallowing. Pertinent negatives include no ear discharge, ear pain or headaches. He has had no exposure to strep. He has tried acetaminophen and NSAIDs for the symptoms. The treatment provided mild relief.     Review of Systems  HENT:  Positive for congestion and trouble swallowing. Negative for ear discharge and ear pain.   Respiratory:  Positive for cough.   Neurological:  Negative for headaches.  All other systems reviewed and are negative.   Observations/Objective: No SOB or distress  noted, grandmother states white patches on bilateral side throat.   Assessment and Plan:  1. Acute pharyngitis, unspecified etiology - amoxicillin (AMOXIL) 500 MG capsule; Take 1 capsule (500 mg total) by mouth 2 (two) times daily for 10 days.  Dispense: 20 capsule; Refill: 0 - Take meds as prescribed - Use a cool mist humidifier  -Use saline nose sprays frequently -Force fluids -For any cough or congestion  Use plain Mucinex- regular strength or max strength is fine -For fever or aces or pains- take tylenol or ibuprofen. -Throat lozenges if help -New toothbrush in 3 days       I discussed the assessment and treatment plan with the patient. The patient was provided an opportunity to ask questions and all were answered. The patient agreed with the plan and demonstrated an understanding of the instructions.   The patient was advised to call back or seek an in-person evaluation if the symptoms worsen or if the condition fails to improve as anticipated.  The above assessment and management plan was discussed with the patient. The patient verbalized understanding of and has agreed to the management plan. Patient is aware to call the clinic if symptoms persist or worsen. Patient is aware when to return to the clinic for a follow-up visit. Patient educated on when it is appropriate to go to the emergency department.   Time call ended:  2:08 pm   I provided 12 minutes of  non face-to-face time during this encounter.    Jannifer Rodney, FNP

## 2020-08-17 ENCOUNTER — Encounter: Payer: Self-pay | Admitting: Family

## 2020-08-17 ENCOUNTER — Other Ambulatory Visit: Payer: Self-pay

## 2020-08-17 ENCOUNTER — Ambulatory Visit: Payer: Medicaid Other | Admitting: Family

## 2020-08-17 VITALS — BP 110/68 | HR 72 | Resp 16 | Ht 66.0 in | Wt 319.2 lb

## 2020-08-17 DIAGNOSIS — Z79899 Other long term (current) drug therapy: Secondary | ICD-10-CM | POA: Diagnosis not present

## 2020-08-17 DIAGNOSIS — Z7189 Other specified counseling: Secondary | ICD-10-CM

## 2020-08-17 DIAGNOSIS — G4710 Hypersomnia, unspecified: Secondary | ICD-10-CM

## 2020-08-17 DIAGNOSIS — Z639 Problem related to primary support group, unspecified: Secondary | ICD-10-CM

## 2020-08-17 DIAGNOSIS — F902 Attention-deficit hyperactivity disorder, combined type: Secondary | ICD-10-CM

## 2020-08-17 DIAGNOSIS — F958 Other tic disorders: Secondary | ICD-10-CM | POA: Diagnosis not present

## 2020-08-17 DIAGNOSIS — R278 Other lack of coordination: Secondary | ICD-10-CM

## 2020-08-17 DIAGNOSIS — F419 Anxiety disorder, unspecified: Secondary | ICD-10-CM | POA: Diagnosis not present

## 2020-08-17 DIAGNOSIS — F819 Developmental disorder of scholastic skills, unspecified: Secondary | ICD-10-CM

## 2020-08-17 DIAGNOSIS — Z719 Counseling, unspecified: Secondary | ICD-10-CM

## 2020-08-17 DIAGNOSIS — R251 Tremor, unspecified: Secondary | ICD-10-CM | POA: Diagnosis not present

## 2020-08-17 DIAGNOSIS — Z818 Family history of other mental and behavioral disorders: Secondary | ICD-10-CM

## 2020-08-17 DIAGNOSIS — F32A Depression, unspecified: Secondary | ICD-10-CM

## 2020-08-17 DIAGNOSIS — Z6841 Body Mass Index (BMI) 40.0 and over, adult: Secondary | ICD-10-CM

## 2020-08-17 NOTE — Progress Notes (Signed)
[facility] [address] [place] [address] Dept: [phone] Dept Fax: [phone] Loc: [phone] Loc Fax: [phone]  Medication Check  Patient ID: Henry [name], male  DOB: 2000/04/15, 20 y.o.  MRN: [id number]  Date of Evaluation: 08/17/2020 PCP: [name], Elige Radon, MD  Accompanied by: grandmother Patient Lives with: grandmother and grandfather's house-in between both households.   HISTORY/CURRENT STATUS: HPI Patient here with grandmother for the visit today. Patient interactive and appropriate with provider. 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 Recent health issues with tooth and taking PCN that has caused significant stomach upset. 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 Sleeping an increased amount. Not getting out of bed.   Individual Medical History/ Review of Systems: Changes? :Tooth decay with increased pain and on PCN daily 500 mg EACH DOSE-4 DAILY NOW ONLY TAKING 2 DAILY.   Allergies: Patient has no known allergies.  Current Medications: Current Outpatient Medications  Medication Instructions   ibuprofen (ADVIL) 600 mg, Oral, Every 8 hours PRN   lisdexamfetamine (VYVANSE) 60 mg, Oral, Daily   Medication Side Effects: None  Family Medical/ Social History: Changes? Yes Had argument with grandfather, over a month ago had issues when was at his father's  last.   MENTAL HEALTH: Mental Health Issues: Depression-no motivation and not getting out of bed. History of symptoms .  PHYSICAL EXAM; Vitals: Vitals:   08/17/20 0947  BP: 110/68  Pulse: 72  Resp: 16  Weight: (!) 319 lb 3.2 oz (144.8 kg)  Height: 5\' 6"  (1.676 m)    General Physical Exam: Unchanged from previous exam, date: 5/52022 Changed:None  DIAGNOSES:    ICD-10-CM   1. ADHD (attention deficit hyperactivity disorder), combined type  F90.2     2. Anxiety and depression  F41.9    F32.A     3. Morbid obesity with body mass index of 50.0-59.9 in adult (HCC)  E66.01    Z68.43     4. Tremor of right hand  R25.1     5. 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 Learning difficulty  F81.9     7. Family history of learning disability in father  Z93.8     8. Dysgraphia  R27.8     9. Medication management  Z79.899     10. Patient counseled  Z71.9     11. Goals of care, counseling/discussion  Z71.89     12. Excessive sleepiness  G47.10     13. Family dynamics problem  Z63.9     ASSESSMENT: Henry [name] is a 20 year old male with a history of ADHD, Anxiety, Depression and Learning difficulties with no current medication for symptom control. Now taking PCN for tooth ache and causing GI issues, so he is not able to take any other medication. PCN is causing decreased appetite and nausea if eating during the day. Has cut back to 2 tablets daily instead of 4 due  to increased GI upset. Sleeping excessively with not having a job or schedule to adhere  to daily. Limited exercise or activity daily. Concerned with ASD and wanting testing through community provider. To administer rating scales for anxiety and depression today to assist with managing symptoms.  RECOMMENDATIONS:  Patient and grandmother provided updates with work, job Psychologist, educational, health, and medical updates since last f/u visit.  Patient requesting testing for ASD and provided information for grandmother and patient to f/u with for  testing. Encouraged to call for placement on testing list for local ASD psychologist for testing.   Recommended scheduling with counselor or psychologist for counseling related to issues with family dynamics with ongoing difficulties with father and grandfather.   Eating habits discussed. Eating a variety of foods when not on other medications. More of a variety of foods and less junk foods to intake daily. Encouraged more water and fluids each day along with fruits/vegetables for fiber.  Exercise encouraged each day. Moving at least 20-30 minutes daily for activity. Options discussed and support provided to patient.  Schedule for at home to be initiated. Bedtime routine along with waking to be consistent. Also encouraged GM to make chore chart for daily and weekly items for patient to accomplish.   Rating scales completed by patient today for anxiety, depression and ADHD with significant scores for each DX. Discussed completion of current medication and calling to start medication to manage his symptoms.   Pharmacogenetic testing reviewed related to history of symptom control. Discussed medication history and current difficulties of anxiety, depression, and ADHD symptoms.   Counseled medication pharmacokinetics, options, dosage, administration, desired effects, and possible side effects.   Vyvanse not taking currently Zoloft discontinued Option for alternative medications for symptoms.    I discussed the assessment and treatment plan with the patient. The patient was provided an opportunity to ask questions and all were answered. The patient agreed with the plan and demonstrated an understanding of the instructions.  NEXT APPOINTMENT: Return in about 3 months (around 11/17/2020) for f/u visit.  Carron Curie, NP Counseling Time: 50 mins Total Contact Time: 55 mins

## 2020-08-18 ENCOUNTER — Encounter: Payer: Self-pay | Admitting: Family

## 2020-09-08 DIAGNOSIS — H5213 Myopia, bilateral: Secondary | ICD-10-CM | POA: Diagnosis not present

## 2020-09-20 ENCOUNTER — Telehealth: Payer: Self-pay | Admitting: Family

## 2020-09-20 NOTE — Telephone Encounter (Signed)
    Mailed form to [place], per [employer].

## 2020-10-26 ENCOUNTER — Other Ambulatory Visit: Payer: Self-pay

## 2020-10-26 ENCOUNTER — Ambulatory Visit (INDEPENDENT_AMBULATORY_CARE_PROVIDER_SITE_OTHER): Payer: Medicaid Other | Admitting: Family

## 2020-10-26 ENCOUNTER — Encounter: Payer: Self-pay | Admitting: Family

## 2020-10-26 VITALS — BP 122/68 | HR 76 | Resp 16 | Ht 66.0 in | Wt 322.4 lb

## 2020-10-26 DIAGNOSIS — F419 Anxiety disorder, unspecified: Secondary | ICD-10-CM

## 2020-10-26 DIAGNOSIS — G2569 Other tics of organic origin: Secondary | ICD-10-CM

## 2020-10-26 DIAGNOSIS — Z6841 Body Mass Index (BMI) 40.0 and over, adult: Secondary | ICD-10-CM

## 2020-10-26 DIAGNOSIS — R278 Other lack of coordination: Secondary | ICD-10-CM | POA: Diagnosis not present

## 2020-10-26 DIAGNOSIS — Z79899 Other long term (current) drug therapy: Secondary | ICD-10-CM | POA: Diagnosis not present

## 2020-10-26 DIAGNOSIS — Z7189 Other specified counseling: Secondary | ICD-10-CM | POA: Diagnosis not present

## 2020-10-26 DIAGNOSIS — F819 Developmental disorder of scholastic skills, unspecified: Secondary | ICD-10-CM

## 2020-10-26 DIAGNOSIS — F32A Depression, unspecified: Secondary | ICD-10-CM | POA: Diagnosis not present

## 2020-10-26 DIAGNOSIS — F902 Attention-deficit hyperactivity disorder, combined type: Secondary | ICD-10-CM

## 2020-10-26 DIAGNOSIS — F411 Generalized anxiety disorder: Secondary | ICD-10-CM

## 2020-10-26 DIAGNOSIS — R251 Tremor, unspecified: Secondary | ICD-10-CM | POA: Diagnosis not present

## 2020-10-26 DIAGNOSIS — Z719 Counseling, unspecified: Secondary | ICD-10-CM

## 2020-10-26 NOTE — Progress Notes (Signed)
Medication Check  Patient ID: Henry [name]  DOB: [birth year]  MRN: [id number]  DATE:10/26/20 Henry [name], Henry Kaufmann, MD  Accompanied by:  self Patient Lives with: grandmother  HISTORY/CURRENT STATUS: HPI Patient here by himself for the visit today. Grandmother drive Henry [name] to the appointment today. NO recent changes with health and looking for a job that is local due to limited travel to work. Helping grandparents with chores at both homes. Not currently taking medications.   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 Some weight lifitng in the apartment.   Screen time: (phone, tablet, TV, computer): video games, TV, and phone.  Driving: Not currently driving due to loss of vehicle  MEDICAL HISTORY: Appetite: Getting more fruits/vegetables and limit soft drinks, no pork  Sleep: Bedtime: 12:00 am  Awakens: 9-10:00 am    Concerns: Initiation/Maintenance/Other: better sleep schedule recently Elimination: No current issues reported  Individual Medical History/ Review of Systems: Changes? :Yes, completed medication for tooth pain and not having tooth extracted due to cost.   Family Medical/ Social History: Changes? Yes Friend going to [place] to get married with a girl that was met by a coworker at his place of employment. Has 2 good friends and hangs out on a regular basis.   Current Medications:  Zoloft with history of taking this medication Not taking Vyvanse 60 mg  Medication Side Effects: None  MENTAL HEALTH: Anxiety and Depression-no medication at this time to assist with controlling symptoms.   PHYSICAL EXAM; Vitals:   10/26/20 1509  BP: 122/68  Pulse: 76  Resp: 16  Weight: (!) 322 lb 6.4 oz (146.2 kg)  Height: _0  (1.676 m)    General Physical Exam: Unchanged from previous exam,  date:08/17/2020   ASSESSMENT:  Henry [name] is a 20 year old with a diagnosis of ADHD, Learning disability, anxiety and depression that is inadequately controlled due to noncompliance with medication. No currently taking any medication due to limited less anxiety and not working. Has been completed forms and release of records for various medical professionals for [facility] to complete ASD testing. Looking for a local job in close proximity to his house due to transportation issues. Walking more and helping with chores. Eating more fruits and limiting his daily intake of sodas with better choices. Sleep hygiene is better with a routine along with nightly schedule Less anxiety recently with cutting off contact with his father. More worried about his close friend that is making bad decisions. No current medication and assess the need for medication management related to symptoms.    DIAGNOSES:    ICD-10-CM   1. ADHD (attention deficit hyperactivity disorder), combined type  F90.2     2. Anxiety and depression  F41.9    F32.A     3. Generalized anxiety disorder  F41.1     4. Morbid obesity with body mass index of 50.0-59.9 in adult ([facility])  E66.01    Z68.43     5. Tremor of right hand  R25.1     6. Learning difficulty  F81.9     7. 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 Medication management  Z79.899     9. Patient counseled  Z71.9     10. Goals of care, counseling/discussion  Z71.89     11. Tics of organic origin  G25.69      RECOMMENDATIONS:  Patient  provided updates for school with no current academics, not working, and no recent contact with vocational rehabilitation.  Not currently working but helping at home with chores and ADL's. Also assists with grandfather's yard with lawn care and any other chores needed to be completed.   Less anxiety recently with cutting off contact with father due to ongoing issues.   [employer] application and waiting for testing related to concerns for ASD.    Applying to  local jobs for him to work at and be able to walk to work due to limited travel abilities.   Discussed recent change in dietary intake with healthier food choices. Also limiting soda intake daily. Getting more physical activity with walking and lifting weights.   Sleep hygiene has been more consistent with being on a routine each night. Only having some "bad" dreams on occasion that concern him and addressed at the visit today.   Counseled medication pharmacokinetics, options, dosage, administration, desired effects, and possible side effects.   Vyvanse discontinued Zoloft discontinued Option for alternative medications for symptoms.    I discussed the assessment and treatment plan with the patient. The patient was provided an opportunity to ask questions and all were answered. The patient agreed with the plan and demonstrated an understanding of the instructions.   NEXT APPOINTMENT:  Return in about 3 months (around 01/26/2021) for f/u visit .

## 2020-11-22 ENCOUNTER — Encounter: Payer: Self-pay | Admitting: Family Medicine

## 2020-11-22 ENCOUNTER — Ambulatory Visit: Payer: Medicaid Other | Admitting: Family Medicine

## 2020-11-22 ENCOUNTER — Other Ambulatory Visit: Payer: Self-pay

## 2020-11-22 VITALS — BP 126/76 | HR 94 | Ht 66.0 in | Wt 324.0 lb

## 2020-11-22 DIAGNOSIS — F989 Unspecified behavioral and emotional disorders with onset usually occurring in childhood and adolescence: Secondary | ICD-10-CM

## 2020-11-22 NOTE — Progress Notes (Signed)
BP 126/76   Pulse 94   Ht 5\' 6"  (1.676 m)   Wt (!) 324 lb (147 kg)   SpO2 96%   BMI 52.29 kg/m    Subjective:   Patient ID: Henry [name], male    DOB: [birth year], 20 y.o.   MRN: [id number]  HPI: Henry [name] is a 20 y.o. male presenting on 11/22/2020 for Medical Management of Chronic Issues (Requesting referral to [facility] in [place]- Autism Program)   HPI Patient comes in today because he has been seeing a psychiatrist for different mood and behavioral disorders and the psychiatrist has recommended him to go to a teach program to get tested for autism spectrum disorders.  He is here today because he needs a referral for that.  He says he has a lot of issues with crowds and social issues and social interactions that do not come natural to him.  He says his psychiatrist was concerned about possible autism spectrum  Relevant past medical, surgical, family and social history reviewed and updated as indicated. Interim medical history since our last visit reviewed. Allergies and medications reviewed and updated.  Review of Systems  Constitutional:  Negative for chills and fever.  Eyes:  Negative for visual disturbance.  Respiratory:  Negative for shortness of breath and wheezing.   Cardiovascular:  Negative for chest pain and leg swelling.  Musculoskeletal:  Negative for back pain and gait problem.  Skin:  Negative for rash.  Neurological:  Negative for dizziness.  Psychiatric/Behavioral:  Positive for dysphoric mood. Negative for self-injury, sleep disturbance and suicidal ideas. The patient is nervous/anxious.   All other systems reviewed and are negative.  Per HPI unless specifically indicated above   Allergies as of 11/22/2020   No Known Allergies      Medication List        Accurate as of November 22, 2020  9:31 AM. If you have any questions, ask your nurse or doctor.          STOP taking these medications    lisdexamfetamine 60 MG capsule Commonly known as:  Vyvanse Stopped by: November 24, 2020 Nazanin Kinner, MD       TAKE these medications    ibuprofen 600 MG tablet Commonly known as: ADVIL Take 1 tablet (600 mg total) by mouth every 8 (eight) hours as needed.         Objective:   BP 126/76   Pulse 94   Ht 5\' 6"  (1.676 m)   Wt (!) 324 lb (147 kg)   SpO2 96%   BMI 52.29 kg/m   Wt Readings from Last 3 Encounters:  11/22/20 (!) 324 lb (147 kg)  06/04/20 (!) 334 lb (151.5 kg)  05/26/20 (!) 328 lb (148.8 kg)    Physical Exam Vitals and nursing note reviewed.  Constitutional:      General: He is not in acute distress.    Appearance: He is well-developed. He is not diaphoretic.  Eyes:     General: No scleral icterus.    Conjunctiva/sclera: Conjunctivae normal.  Neck:     Thyroid: No thyromegaly.  Cardiovascular:     Rate and Rhythm: Normal rate and regular rhythm.     Heart sounds: Normal heart sounds. No murmur heard. Pulmonary:     Effort: Pulmonary effort is normal. No respiratory distress.     Breath sounds: Normal breath sounds. No wheezing.  Musculoskeletal:        General: Normal range of motion.     Cervical back:  Neck supple.  Lymphadenopathy:     Cervical: No cervical adenopathy.  Skin:    General: Skin is warm and dry.     Findings: No rash.  Neurological:     Mental Status: He is alert and oriented to person, place, and time.     Coordination: Coordination normal.  Psychiatric:        Attention and Perception: He is inattentive.        Mood and Affect: Mood is anxious and depressed.        Thought Content: Thought content does not include suicidal ideation. Thought content does not include suicidal plan.      Assessment & Plan:   Problem List Items Addressed This Visit   None Visit Diagnoses     Behavioral and emotional disorder with onset in childhood    -  Primary   Relevant Orders   Ambulatory referral to Psychiatry       We will do a referral for program for autism spectrum evaluation. Follow  up plan: Return if symptoms worsen or fail to improve, for Needs physical exam and fasting labs at some point in the future.  Counseling provided for all of the vaccine components Orders Placed This Encounter  Procedures   Ambulatory referral to Psychiatry     Caryl Pina, MD [facility] 11/22/2020, 9:31 AM

## 2020-11-30 ENCOUNTER — Telehealth: Payer: Self-pay | Admitting: Family

## 2020-11-30 NOTE — Telephone Encounter (Signed)
    Faxed requested records to [facility]. 

## 2020-12-08 ENCOUNTER — Encounter: Payer: Self-pay | Admitting: Nurse Practitioner

## 2020-12-08 ENCOUNTER — Ambulatory Visit: Payer: Medicaid Other | Admitting: Nurse Practitioner

## 2020-12-08 VITALS — Temp 99.1°F

## 2020-12-08 DIAGNOSIS — U071 COVID-19: Secondary | ICD-10-CM | POA: Insufficient documentation

## 2020-12-08 MED ORDER — MOLNUPIRAVIR EUA 200MG CAPSULE: 4.0000 | ORAL_CAPSULE | Freq: Two times a day (BID) | ORAL | 0 refills | Status: AC

## 2020-12-08 MED ORDER — BENZONATATE 100 MG PO CAPS: 100.0000 mg | ORAL_CAPSULE | Freq: Three times a day (TID) | ORAL | 0 refills | Status: DC | PRN

## 2020-12-08 NOTE — Assessment & Plan Note (Signed)
Take meds as prescribed - Use a cool mist humidifier  -Use saline nose sprays frequently -Force fluids -For fever or aches or pains- take Tylenol or ibuprofen. -At home COVID test positive. -Molnupirivir RX sent to pharmacy.  Education provided to patient, patient verbalized understanding. -Benzonate for cough Follow up with worsening unresolved symptoms

## 2020-12-08 NOTE — Progress Notes (Signed)
   Virtual Visit  Note Due to COVID-19 pandemic this visit was conducted virtually. This visit type was conducted due to national recommendations for restrictions regarding the COVID-19 Pandemic (e.g. social distancing, sheltering in place) in an effort to limit this patient's exposure and mitigate transmission in our community. All issues noted in this document were discussed and addressed.  A physical exam was not performed with this format.  I connected with Henry [name] on 12/08/20 at 8:11 AM by telephone and verified that I am speaking with the correct person using two identifiers. Henry [name] is currently located at home during visit. The provider, Daryll Drown, NP is located in their office at time of visit.  I discussed the limitations, risks, security and privacy concerns of performing an evaluation and management service by telephone and the availability of in person appointments. I also discussed with the patient that there may be a patient responsible charge related to this service. The patient expressed understanding and agreed to proceed.   History and Present Illness:  Cough This is a new problem. The current episode started yesterday. The problem has been unchanged. The problem occurs constantly. The cough is Non-productive. Associated symptoms include chills and a fever. Pertinent negatives include no rash or sore throat. Exacerbated by: Tested positive for COVID-19. He has tried nothing for the symptoms.     Review of Systems  Constitutional:  Positive for chills, fever and malaise/fatigue.  HENT:  Negative for sore throat.   Respiratory:  Positive for cough.   Gastrointestinal:  Positive for nausea.  Skin:  Negative for rash.  All other systems reviewed and are negative.   Observations/Objective: Televisit patient not in distress.  Assessment and Plan: Take meds as prescribed - Use a cool mist humidifier  -Use saline nose sprays frequently -Force fluids -For  fever or aches or pains- take Tylenol or ibuprofen. -At home COVID test positive. -Molnupirivir RX sent to pharmacy.  Education provided to patient, patient verbalized understanding. -Benzonate for cough Follow up with worsening unresolved symptoms   Follow Up Instructions: Follow-up with worsening unresolved symptoms.    I discussed the assessment and treatment plan with the patient. The patient was provided an opportunity to ask questions and all were answered. The patient agreed with the plan and demonstrated an understanding of the instructions.   The patient was advised to call back or seek an in-person evaluation if the symptoms worsen or if the condition fails to improve as anticipated.  The above assessment and management plan was discussed with the patient. The patient verbalized understanding of and has agreed to the management plan. Patient is aware to call the clinic if symptoms persist or worsen. Patient is aware when to return to the clinic for a follow-up visit. Patient educated on when it is appropriate to go to the emergency department.   Time call ended: 10:21 AM  I provided 10 minutes of  non face-to-face time during this encounter.    Daryll Drown, NP

## 2020-12-22 DIAGNOSIS — F339 Major depressive disorder, recurrent, unspecified: Secondary | ICD-10-CM | POA: Diagnosis not present

## 2021-01-11 ENCOUNTER — Other Ambulatory Visit: Payer: Self-pay

## 2021-01-11 ENCOUNTER — Ambulatory Visit: Payer: Medicaid Other | Admitting: Orthopaedic Surgery

## 2021-01-11 ENCOUNTER — Encounter: Payer: Self-pay | Admitting: Orthopaedic Surgery

## 2021-01-11 VITALS — BP 123/82 | HR 75 | Ht 65.0 in | Wt 334.8 lb

## 2021-01-11 DIAGNOSIS — M79672 Pain in left foot: Secondary | ICD-10-CM | POA: Diagnosis not present

## 2021-01-11 DIAGNOSIS — M2141 Flat foot [pes planus] (acquired), right foot: Secondary | ICD-10-CM

## 2021-01-11 DIAGNOSIS — M79671 Pain in right foot: Secondary | ICD-10-CM | POA: Diagnosis not present

## 2021-01-11 DIAGNOSIS — M2142 Flat foot [pes planus] (acquired), left foot: Secondary | ICD-10-CM | POA: Diagnosis not present

## 2021-01-11 DIAGNOSIS — Z6841 Body Mass Index (BMI) 40.0 and over, adult: Secondary | ICD-10-CM | POA: Diagnosis not present

## 2021-01-11 NOTE — Progress Notes (Signed)
Subjective:    Patient ID: Henry [name], male    DOB: [birth year], 20 y.o.   MRN: [id number]  HPI He has bilateral foot pain.  He used to have inserts for his feet but they wore out.  He has tendency for flat foot and has foot pain after standing.  He says his feet roll in and out.  He wants new inserts.  He has no trauma.  He has no redness or numbness.   Review of Systems  Constitutional:  Positive for activity change.  Musculoskeletal:  Positive for arthralgias, gait problem and joint swelling.  Psychiatric/Behavioral:  The patient is nervous/anxious.   All other systems reviewed and are negative. For Review of Systems, all other systems reviewed and are negative.  The following is a summary of the past history medically, past history surgically, known current medicines, social history and family history.  This information is gathered electronically by the computer from prior information and documentation.  I review this each visit and have found including this information at this point in the chart is beneficial and informative.   Past Medical History:  Diagnosis Date   Acne    ADHD (attention deficit hyperactivity disorder)    Anxiety    Depression     History reviewed. No pertinent surgical history.  Current Outpatient Medications on File Prior to Visit  Medication Sig Dispense Refill   ibuprofen (ADVIL) 600 MG tablet Take 1 tablet (600 mg total) by mouth every 8 (eight) hours as needed. 30 tablet 0   nortriptyline (PAMELOR) 25 MG capsule Take 25 mg by mouth at bedtime.     No current facility-administered medications on file prior to visit.    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 He lives with both parents and he has 1 sister. He enjoys playing video games and he loves history class   Social Determinants of Corporate investment banker Strain: Not on file  Food Insecurity: Not on file  Transportation Needs: Not on file  Physical Activity: Not on file  Stress: Not on file  Social Connections: Not on file  Intimate Partner Violence: Not on file    Family History  Problem Relation Age of Onset   Thyroid disease Mother    Diabetes Other    Heart disease Other     BP 123/82    Pulse 75    Ht 5\' 5"  (1.651 m)    Wt (!) 334 lb 12.8 oz (151.9 kg)    BMI 55.71 kg/m   Body mass index is 55.71 kg/m.     Objective:   Physical Exam Vitals and nursing note reviewed. Exam conducted with a chaperone present.  Constitutional:      Appearance: He is well-developed.  HENT:  Head: Normocephalic and atraumatic.  Eyes:     Conjunctiva/sclera: Conjunctivae normal.     Pupils: Pupils are equal, round, and reactive to light.  Cardiovascular:     Rate and Rhythm: Normal rate and regular rhythm.  Pulmonary:     Effort: Pulmonary effort is normal.  Abdominal:     Palpations: Abdomen is soft.  Musculoskeletal:     Cervical back: Normal range of motion and neck supple.       Legs:  Skin:    General: Skin is warm and dry.  Neurological:     Mental Status: He is alert and oriented to person, place, and time.     Cranial Nerves: No cranial nerve deficit.     Motor: No abnormal muscle tone.     Coordination: Coordination normal.     Deep Tendon Reflexes: Reflexes are normal and symmetric. Reflexes normal.  Psychiatric:        Behavior: Behavior normal.        Thought  Content: Thought content normal.        Judgment: Judgment normal.          Assessment & Plan:   Encounter Diagnoses  Name Primary?   Bilateral pes planus    Bilateral foot pain Yes   Body mass index 50.0-59.9, adult (HCC)    Morbid obesity (HCC)    I will have podiatry see him for inserts.  I will see as needed. Call if any problem.  Precautions discussed.  Electronically Signed Darreld Mclean, MD 12/20/20228:39 AM

## 2021-01-11 NOTE — Patient Instructions (Addendum)
Make sure you get new supportive shoes for your feet   We will set you up with appointment with Podiatry and they will make some orthotics for you they will call you the office is in [facility] the foot for the toenail in warm water, use some epsom salts if you would like

## 2021-01-13 ENCOUNTER — Other Ambulatory Visit: Payer: Self-pay

## 2021-01-13 ENCOUNTER — Ambulatory Visit: Payer: Medicaid Other | Admitting: Podiatry

## 2021-01-13 ENCOUNTER — Ambulatory Visit (INDEPENDENT_AMBULATORY_CARE_PROVIDER_SITE_OTHER): Payer: Medicaid Other

## 2021-01-13 DIAGNOSIS — L600 Ingrowing nail: Secondary | ICD-10-CM

## 2021-01-13 DIAGNOSIS — Q666 Other congenital valgus deformities of feet: Secondary | ICD-10-CM

## 2021-01-13 NOTE — Progress Notes (Signed)
He presents back this year complaining of pain to the medial longitudinal arch of the bilateral foot.  Objective: Vital signs are stable alert and oriented x3.  Pulses are palpable.  There is no erythema edema/drainage or odor with ambulation appears to be an antalgic gait does demonstrate pes planovalgus with ambulation.  Mild tenderness on palpation medial calcaneal tubercle bilateral.  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  Think the majority of it is associated with midfoot and ankle collapse.  Radiographs taken today demonstrate osseously mature individual with mild pes planus.  No acute findings.  Assessment: Pes planovalgus bilateral.  He does have some Planter fasciitis.  Ingrown toenail hallux left  Plan: I referred him to [facility] to have a set of orthotics made this should alleviate his symptoms otherwise he will be heel injections for the fasciitis.  Debrided the single toenail.

## 2021-01-27 ENCOUNTER — Other Ambulatory Visit: Payer: Self-pay

## 2021-01-27 ENCOUNTER — Ambulatory Visit (INDEPENDENT_AMBULATORY_CARE_PROVIDER_SITE_OTHER): Payer: Medicaid Other | Admitting: Family

## 2021-01-27 ENCOUNTER — Encounter: Payer: Self-pay | Admitting: Family

## 2021-01-27 VITALS — BP 120/68 | HR 84 | Resp 16 | Ht 66.0 in | Wt 326.8 lb

## 2021-01-27 DIAGNOSIS — F902 Attention-deficit hyperactivity disorder, combined type: Secondary | ICD-10-CM

## 2021-01-27 DIAGNOSIS — F339 Major depressive disorder, recurrent, unspecified: Secondary | ICD-10-CM | POA: Diagnosis not present

## 2021-01-27 DIAGNOSIS — Z79899 Other long term (current) drug therapy: Secondary | ICD-10-CM

## 2021-01-27 DIAGNOSIS — G4700 Insomnia, unspecified: Secondary | ICD-10-CM

## 2021-01-27 DIAGNOSIS — R6510 Systemic inflammatory response syndrome (SIRS) of non-infectious origin without acute organ dysfunction: Secondary | ICD-10-CM | POA: Diagnosis not present

## 2021-01-27 DIAGNOSIS — Z6841 Body Mass Index (BMI) 40.0 and over, adult: Secondary | ICD-10-CM

## 2021-01-27 DIAGNOSIS — F958 Other tic disorders: Secondary | ICD-10-CM

## 2021-01-27 DIAGNOSIS — R251 Tremor, unspecified: Secondary | ICD-10-CM

## 2021-01-27 DIAGNOSIS — F4321 Adjustment disorder with depressed mood: Secondary | ICD-10-CM | POA: Diagnosis not present

## 2021-01-27 DIAGNOSIS — Z719 Counseling, unspecified: Secondary | ICD-10-CM | POA: Diagnosis not present

## 2021-01-27 DIAGNOSIS — F411 Generalized anxiety disorder: Secondary | ICD-10-CM

## 2021-01-27 DIAGNOSIS — F4320 Adjustment disorder, unspecified: Secondary | ICD-10-CM

## 2021-01-27 MED ORDER — HYDROXYZINE HCL 25 MG PO TABS: 25.0000 mg | ORAL_TABLET | Freq: Every day | ORAL | 1 refills | Status: DC

## 2021-01-27 NOTE — Patient Instructions (Signed)
Grief Counseling-  [facility]   [phone]

## 2021-01-27 NOTE — Progress Notes (Signed)
[facility] [address], [address] [place] [address] Dept: [phone] Dept Fax: [phone] Loc: [phone] Loc Fax: [phone]  Medication Check  Patient ID: Henry [name], male  DOB: [birth year], 21 y.o.  MRN: [id number]  Date of Evaluation: 01/27/2021 PCP: Leslie Andrea, MD  Accompanied by:  Self Patient Lives with: grandmother and grandfather-between both households  HISTORY/CURRENT STATUS: HPI Patient here by himself today. Interactive and appropriate with provider today. Sadness due to emotional loss of his biological mother recently. Increased anxiety and depressed feelings recently with not understanding or dealing with his emotions. Not getting any counseling and was seen by a new PCP with Rx to assist with sleeping. NO current medication taken and discontinued the Rx prescribed by the PCP due to side effects.   EDUCATION/WORK: Applied to work at [employer] in [place] application completed along with initial visit. Waiting on psychological exam. Next appt on 02/19/2021 for SSI and testing.   Activities/ Exercise: repairing items in the house and putting back together. Helping to household chores to keep his mind busy.   MEDICAL HISTORY: Appetite: unsure with his eatig pattern recently due to increased grief  MVI/Other: None Sleep: Bedtime: 11:00 pm  Awakens: 6-7:00 am   Concerns: Initiation/Maintenance/Other: Reading the Bible with Nanny at night. Takes about 1 1/2 hours some nights to a fall asleep. Some nights goes to sleep at 3-4:00 am   Individual Medical History/ Review of Systems: Changes? :Yes established new PCP and was prescribed medication for sleep, but patient did not like the medication and discontinued.   Allergies: Patient has no known allergies.  Current Medications:  Current Outpatient Medications  Medication  Instructions   hydrOXYzine (ATARAX) 25 mg, Oral, Daily at bedtime   ibuprofen (ADVIL) 600 mg, Oral, Every 8 hours PRN   Medication Side Effects: None  Family Medical/ Social History: Changes? Yes, met biological mother recently. Mother passed away from esophogeal cancer.   MENTAL HEALTH: Mental Health Issues: Depression and Anxiety-increased recently with the loss of his biological mother.   PHYSICAL EXAM; Vitals:  Vitals:   01/27/21 1309  BP: 120/68  Pulse: 84  Resp: 16  Weight: (!) 326 lb 12.8 oz (148.2 kg)  Height: _0  (1.676 m)    General Physical Exam: Unchanged from previous exam, date:10/26/2020 Changed:None  DIAGNOSES:    ICD-10-CM   1. ADHD (attention deficit hyperactivity disorder), combined type  F90.2     2. SIRS (systemic inflammatory response syndrome) (HCC)  R65.10     3. Generalized anxiety disorder  F41.1     4. Tremor of right hand  R25.1     5. Depression, recurrent (Henry [name])  F33.9     6. Morbid obesity with body mass index of 50.0-59.9 in adult (HCC)  E66.01    Z68.43     7. 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 Medication management  Z79.899     9. Patient counseled  Z71.9     10. 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 Grief reaction  F43.21      ASSESSMENT: Henry [name] is a 21 year old male with a history of ADHD, Anxiety, Depression, Learning disability and Obesity. He has not been on any medication from this provider in the past several months. Had seen PCP due to grief of the loss of his biological mother after meeting 3 days prior to her  death. Had seen a new PCP and prescribed medication for sleep that he took for oly 3 days due to side effects, Not currently working and applying for jobs. [place] recently met with [facility] for the initial intake and is scheduled for testing with SSI for disability. Eating has been less due to grief and not exercising much right now, but helping with household chores. Patient needing grief support and medication  management for sleep initiation and maintenance.   RECOMMENDATIONS:  Updates with job search, [employer], home, family and medical changes since the last f/u visit.  Not currently work and support applying to [employer] for night shift work.  Discussed [employer] initial intake with patient for ASD testing and future appointments.  Grief response and provided information for counseling services to assist with bereavement.   Anxiety and depressed affected discussed at length. Needing appropriate medication management for symptoms.   Sleep schedule has been off and not participating with good sleep hygiene practice. Recent issues with the loss of his biological mother after just meeting her 3 days before.  Pharmacogenetic swab testing completed with [facility] in the office today. Discussed swab for appropriate medication management and information provided for patient to review.   Counseled medication pharmacokinetics, options, dosage, administration, desired effects, and possible side effects.   Hydroxyzine 25 mg 1-2 at HS for sleep initiation, # 30 with 1 RF's.RX for above e-scribed and sent to pharmacy on record  [facility] [address] [place] [address] Phone: [phone] Fax: [phone]  I discussed the assessment and treatment plan with the patient. The patient was provided an opportunity to ask questions and all were answered. The patient agreed with the plan and demonstrated an understanding of the instructions.  NEXT APPOINTMENT: Return in about 3 months (around 04/27/2021) for f/u visit .  The patient was advised to call back or seek an in-person evaluation if the symptoms worsen or if the condition fails to improve as anticipated.  Carolann Littler, NP

## 2021-02-02 ENCOUNTER — Encounter: Payer: Self-pay | Admitting: Family

## 2021-02-02 DIAGNOSIS — G4700 Insomnia, unspecified: Secondary | ICD-10-CM | POA: Diagnosis not present

## 2021-02-02 DIAGNOSIS — J069 Acute upper respiratory infection, unspecified: Secondary | ICD-10-CM | POA: Diagnosis not present

## 2021-02-02 DIAGNOSIS — F339 Major depressive disorder, recurrent, unspecified: Secondary | ICD-10-CM | POA: Diagnosis not present

## 2021-02-21 ENCOUNTER — Encounter: Payer: Self-pay | Admitting: Family

## 2021-02-21 ENCOUNTER — Telehealth (INDEPENDENT_AMBULATORY_CARE_PROVIDER_SITE_OTHER): Payer: Medicaid Other | Admitting: Family

## 2021-02-21 ENCOUNTER — Other Ambulatory Visit: Payer: Self-pay

## 2021-02-21 DIAGNOSIS — F819 Developmental disorder of scholastic skills, unspecified: Secondary | ICD-10-CM | POA: Diagnosis not present

## 2021-02-21 DIAGNOSIS — F902 Attention-deficit hyperactivity disorder, combined type: Secondary | ICD-10-CM

## 2021-02-21 DIAGNOSIS — F419 Anxiety disorder, unspecified: Secondary | ICD-10-CM

## 2021-02-21 DIAGNOSIS — Z79899 Other long term (current) drug therapy: Secondary | ICD-10-CM | POA: Diagnosis not present

## 2021-02-21 DIAGNOSIS — R278 Other lack of coordination: Secondary | ICD-10-CM

## 2021-02-21 DIAGNOSIS — Z7189 Other specified counseling: Secondary | ICD-10-CM

## 2021-02-21 DIAGNOSIS — F32A Depression, unspecified: Secondary | ICD-10-CM

## 2021-02-21 DIAGNOSIS — G2569 Other tics of organic origin: Secondary | ICD-10-CM | POA: Diagnosis not present

## 2021-02-21 DIAGNOSIS — Z6841 Body Mass Index (BMI) 40.0 and over, adult: Secondary | ICD-10-CM | POA: Diagnosis not present

## 2021-02-21 DIAGNOSIS — Z719 Counseling, unspecified: Secondary | ICD-10-CM | POA: Diagnosis not present

## 2021-02-21 DIAGNOSIS — Z8669 Personal history of other diseases of the nervous system and sense organs: Secondary | ICD-10-CM | POA: Diagnosis not present

## 2021-02-21 MED ORDER — JORNAY PM 20 MG PO CP24: 20.0000 mg | ORAL_CAPSULE | Freq: Every day | ORAL | 0 refills | Status: DC

## 2021-02-21 NOTE — Progress Notes (Signed)
[facility] [place] [address] Dept: [phone] Dept Fax: [phone]  Medication Check visit via Virtual Video   Patient ID:  Henry [name]  male DOB: [birth year]   21 y.o.   MRN: [id number]   DATE:02/21/21  PCP: Leslie Andrea, MD  Virtual Visit via Video Note  I connected with  Henry [name] on 02/21/21 at  2:00 PM EST by a video enabled telemedicine application and verified that I am speaking with the correct person using two identifiers. Patient/Parent Location: in a parked car   I discussed the limitations, risks, security and privacy concerns of performing an evaluation and management service by telephone and the availability of in person appointments. I also discussed with the parents that there may be a patient responsible charge related to this service. The parents expressed understanding and agreed to proceed.  Provider: Carolann Littler, NP  Location: private work location  HPI/CURRENT STATUS: Henry [name] is here for medication management of the psychoactive medications for ADHD and review of educational and behavioral concerns.   Henry [name] currently taking Hydroxyzine at night  which is working well. Takes medication as needed for sleep initiation. Is not currently taking medication for his ADHD. Henry [name] is unable to focus through chores.   Henry [name] is eating well (eating breakfast, lunch and dinner). Henry [name] has been eating less with smaller portions for healthy meal sizes.   Sleeping well (goes to bed at 10-12:00 am wakes at 8-9:00 am), sleeping through the night. Henry [name] has some delayed sleep onset and taking the Hydroxyzine for initiation.   EDUCATION/WORK: Applied to [employer] in [place] and will apply to [employer] in [facility] application completed along with initial visit and recent psychological evaluation. NO results recently.  Activities/ Exercise:  intermittently-running and going up and down the stairs. Trying to workout every day. 1 hour with walking and stairs for about 30 mins.   MEDICAL HISTORY: Individual Medical History/ Review of Systems: NONE recently  Has been healthy with no visits to the PCP. Well Chceck due yearly check with PCP. Feb 15 th meeting with the orthopedic for inserts that were ordered.   Family Medical/ Social History: Changes? Yes, GF with fluid on his eye.  Patient Lives with: grandmother and grandfather-goes back and forth to help.   MENTAL HEALTH: Mental Health Issues: Depression and Anxiety-less now with more exercise to assist with his emotions.  Allergies: No Known Allergies  Current Medications:  Current Outpatient Medications  Medication Instructions   hydrOXYzine (ATARAX) 25 mg, Oral, Daily at bedtime   ibuprofen (ADVIL) 600 mg, Oral, Every 8 hours PRN   Jornay PM 20 mg, Oral, Daily at bedtime   Medication Side Effects: None  DIAGNOSES:    ICD-10-CM   1. ADHD (attention deficit hyperactivity disorder), combined type  F90.2     2. Anxiety and depression  F41.9    F32.A     3. Morbid obesity with body mass index of 50.0-59.9 in adult (HCC)  E66.01    Z68.43     4. 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 History of sleep disorder  Z86.69     6. Learning difficulty  F81.9     7. 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 Medication management  Z79.899     9. Patient counseled  Z71.9     10. Goals of care, counseling/discussion  Z71.89  ASSESSMENT:     Henry [name] is a 21 year old male with a history of ADHD, Learning disability, Anxiety and depressed affect. Recently he has bee more concerned with possible ASD and completed Psychoeducational testing through [facility]. 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 Helping both grandmother and grandfather at each others homes with chores or transportation to appointments. Henry [name] has become more active with attempting to  lose weight with exercising on a regular basis and using portion control with his meals. Sleeping better recently with less stress and coping better with loss of his biological mother. Using hydroxyzine as needed, but trying to fall asleep on his own. No recent changes with his health, but did have orthotics made for his shoes. Anxiety has continued along with some depressed mood and handling this better as he reports. Not currently receiving grief counseling for the loss of his mother. Information regarding treatment for his ADHD has been the greatest concern for Henry [name] due to his limited attention span. Medication options for treatment regarding current symptoms will be discussed based on pharmacogenetic testing results.   PLAN/RECOMMENDATIONS:  Updates for health, jobs, applications and recent testing discussed with patient since last f/u visit.  [employer] has completed his Psychoeducational testing and will meet to discuss results in the next few weeks.   Discussed job applications and options for work close to his home.  More physical activity over the past few weeks reported by Henry [name] to assist with weight management.  Eating has been more controlled with portion sizes and healthier food choices.  Anxiety and Depressed mood discussed with patient related to death of his mother. Not in counseling and previously recommended grief counseling.   Sleep schedule has been better with use of Hydroxyzine as needed for initiation.   Reviewed testing results for symptom control with the pharmacogenetic swab. Discussion of treatment of his ADHD per patient for his inability to stay focused.   Counseled medication pharmacokinetics, options, dosage, administration, desired effects, and possible side effects.   Jornay pm 20 mg daily, # 30 with no RF's.RX for above e-scribed and sent to pharmacy on record  [facility] [address], [place] [address] Phone:  [phone] Fax: [phone]  I discussed the assessment and treatment plan with the patient. The patient was provided an opportunity to ask questions and all were answered. The patient agreed with the plan and demonstrated an understanding of the instructions.   NEXT APPOINTMENT:  Visit date not found-f/u visit  Telehealth OK  The patient was advised to call back or seek an in-person evaluation if the symptoms worsen or if the condition fails to improve as anticipated.   Carolann Littler, NP

## 2021-02-22 ENCOUNTER — Telehealth: Payer: Self-pay

## 2021-02-22 NOTE — Telephone Encounter (Signed)
Outcome Approvedtoday [place] Case: [id number], Status: Approved, Coverage Starts on: 02/22/2021 12:00:00 AM, Coverage Ends on: 02/22/2022 12:00:00 AM.

## 2021-03-09 DIAGNOSIS — M7660 Achilles tendinitis, unspecified leg: Secondary | ICD-10-CM | POA: Diagnosis not present

## 2021-04-07 ENCOUNTER — Ambulatory Visit: Payer: Medicaid Other | Admitting: Family

## 2021-04-07 ENCOUNTER — Encounter: Payer: Self-pay | Admitting: Family

## 2021-04-07 ENCOUNTER — Other Ambulatory Visit: Payer: Self-pay

## 2021-04-07 VITALS — BP 120/76 | HR 72 | Resp 16 | Ht 66.0 in | Wt 324.8 lb

## 2021-04-07 DIAGNOSIS — Z79899 Other long term (current) drug therapy: Secondary | ICD-10-CM | POA: Diagnosis not present

## 2021-04-07 DIAGNOSIS — Z7189 Other specified counseling: Secondary | ICD-10-CM | POA: Diagnosis not present

## 2021-04-07 DIAGNOSIS — F419 Anxiety disorder, unspecified: Secondary | ICD-10-CM | POA: Diagnosis not present

## 2021-04-07 DIAGNOSIS — Z6841 Body Mass Index (BMI) 40.0 and over, adult: Secondary | ICD-10-CM

## 2021-04-07 DIAGNOSIS — F819 Developmental disorder of scholastic skills, unspecified: Secondary | ICD-10-CM

## 2021-04-07 DIAGNOSIS — Z719 Counseling, unspecified: Secondary | ICD-10-CM

## 2021-04-07 DIAGNOSIS — G2569 Other tics of organic origin: Secondary | ICD-10-CM | POA: Diagnosis not present

## 2021-04-07 DIAGNOSIS — Z639 Problem related to primary support group, unspecified: Secondary | ICD-10-CM | POA: Diagnosis not present

## 2021-04-07 DIAGNOSIS — F902 Attention-deficit hyperactivity disorder, combined type: Secondary | ICD-10-CM | POA: Diagnosis not present

## 2021-04-07 DIAGNOSIS — R278 Other lack of coordination: Secondary | ICD-10-CM

## 2021-04-07 DIAGNOSIS — F32A Depression, unspecified: Secondary | ICD-10-CM | POA: Diagnosis not present

## 2021-04-07 DIAGNOSIS — Z818 Family history of other mental and behavioral disorders: Secondary | ICD-10-CM

## 2021-04-07 MED ORDER — HYDROXYZINE HCL 25 MG PO TABS: 25.0000 mg | ORAL_TABLET | Freq: Every day | ORAL | 1 refills | Status: DC

## 2021-04-07 MED ORDER — JORNAY PM 20 MG PO CP24: 20.0000 mg | ORAL_CAPSULE | Freq: Every day | ORAL | 0 refills | Status: DC

## 2021-04-07 NOTE — Progress Notes (Signed)
?[facility] ?[facility] ?[facility] ?[address], [address] ?[place] [address] ?Dept: [phone] ?Dept Fax: [phone] ?Loc: [phone] ?Loc Fax: [phone] ? ?Medication Check ? ?Patient ID: Henry [name], male  DOB: 03/07/2000, 21 y.o.  MRN: [id number] ? ?Date of Evaluation: 04/07/2021 ?PCP: [name], MD ? ?Accompanied by:  self ?Patient Lives with:  grandmother and grandfather-depending on their health status ? ?HISTORY/CURRENT STATUS: ?HPI Patient here by himself and interactive with provider. Appropriate with conversation with some derailment during the conversation with easy redirected. Has been applying to various jobs. No updates from [facility] since January.   ? ?EDUCATION/WORK: ?Applying for several jobs recently ?Waiting to hear back from the stores ? 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  ? ?MEDICAL HISTORY: ?Appetite: Portion control ?MVI/Other: None   ?Fruits/Vegs: Eating more fruit recently and V8 Juice Calcium:some, Boost on occasion for protein/meal replacement  ?Protein: chicken mostly ? ?Sleep: Bedtime: 10:00 pm  Awakens: 7-8:00  Concerns: no current concerns Initiation/Maintenance/Other: Taking every other night to fall asleep.  ? ?Individual Medical History/ Review of Systems: Changes? :None reported recently, 3 weeks ago had ankle injury and still sore.  ? ?Allergies: Patient has no known allergies. ? ?Current Medications:  ?Current Outpatient Medications  ?Medication Instructions  ? hydrOXYzine (ATARAX) 25 mg, Oral, Daily at bedtime  ? ibuprofen (ADVIL) 600 mg, Oral, Every 8 hours PRN  ? Jornay PM 20 mg, Oral, Daily at bedtime  ? ?Medication Side Effects: None ? ?Family Medical/ Social History: Changes? None  ? 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  ? ?PHYSICAL EXAM; ?Vitals:  ? ?General Physical Exam: ?Unchanged from previous exam, date:02/21/2021 Changed:None  ? ?DIAGNOSES:  ?  ICD-10-CM   ?1. ADHD (attention deficit hyperactivity disorder), combined type  F90.2   ?  ?2. Anxiety and depression  F41.9   ? F32.A   ?  ?3. Morbid obesity with body mass index of 50.0-59.9 in adult [facility])  E66.01   ? T06.26   ?  ?4. Tics of organic origin  G25.69   ?  ?5. Dysgraphia  R27.8   ?  ?6. Learning difficulty  F81.9   ?  ?7. Family history of learning disability in father  Z41.8   ?  ?8. Family dynamics problem  Z63.9   ?  ?9. Medication management  Z79.899   ?  ?10. Patient counseled  Z71.9   ?  ?11. Goals of care, counseling/discussion  Z71.89   ?  ? ?ASSESSMENT: ?Polo is a 21 year old male with a history of ADHD, Learning disability, Anxiety and Depression. Currently taktng Jornay pm 20 mg at HS with no side effects. Taking at night with good efficacy during the day. 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 Applied to several jobs and waiting back to hear from the stores. [facility] has not contacted patient since January and no results for his testing. NO medical changes since January. Eating better with good portion control recently. 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 Sleeping well with use of Hydroxyzine 25 mg at HS with no concerns. Will continue with Jornay pm 20 mg daily at HS, no changes today.  ? ?  RECOMMENDATIONS:  ?Recent changes provided from [employer] for his job search, health, application process and testing for ASD. ? ?[facility] has not been in contact with patient and recommended f/u call for appt to discuss testing.  ? ?Has applied to several job positions recently and waiting for return calls for the jobs.  ? ?Eating better food choices with healthier options.  Limiting amount of junk food and using portion control. ? ?Exercising daily with good amount of lifting and walk with encouragement.  ? ?Encouraged more socialization and interacting more with friends.  ? ?Sleeping well with no current concerns. Better sleep schedule and using Hydroxyzine nightly for sleep initiation.  ? ?Counseled medication pharmacokinetics, options, dosage, administration, desired effects, and possible side effects.   ?Jornay pm 20 mg daily, # 30 with no RF's. ?Hydroxyzine 25 mg daily at HS, # 30 with 1 RF's ?RX for above e-scribed and sent to pharmacy on record ? ?[facility] [address], [place] - [address] ?[address] ?[place] [address] ?Phone: [phone] Fax: [phone] ? ?I discussed the assessment and treatment plan with the patient. The patient was provided an opportunity to ask questions and all were answered. The patient agreed with the plan and demonstrated an understanding of the instructions. ? ?NEXT APPOINTMENT: Return in about 3 months (around 07/08/2021) for f/u visit . ? ?The patient was advised to call back or seek an in-person evaluation if the symptoms worsen or if the condition fails to improve as anticipated. ? ?Carron Curie, NP  ?

## 2021-04-20 DIAGNOSIS — M79672 Pain in left foot: Secondary | ICD-10-CM | POA: Diagnosis not present

## 2021-04-27 DIAGNOSIS — M722 Plantar fascial fibromatosis: Secondary | ICD-10-CM | POA: Diagnosis not present

## 2021-04-27 DIAGNOSIS — M79672 Pain in left foot: Secondary | ICD-10-CM | POA: Diagnosis not present

## 2021-04-28 ENCOUNTER — Other Ambulatory Visit: Payer: Self-pay

## 2021-04-28 MED ORDER — JORNAY PM 20 MG PO CP24: 20.0000 mg | ORAL_CAPSULE | Freq: Every day | ORAL | 0 refills | Status: DC

## 2021-04-28 NOTE — Telephone Encounter (Signed)
Jornay pm 20 mg at HS, # 30 with no RF's.RX for above e-scribed and sent to pharmacy on record ? ?[place], [place] ?[place] [address] ?Phone: [phone] Fax: [phone] ? ? ?

## 2021-05-05 DIAGNOSIS — M722 Plantar fascial fibromatosis: Secondary | ICD-10-CM | POA: Insufficient documentation

## 2021-05-14 IMAGING — DX DG KNEE 1-2V*L*
2 series · 2 of 2 positions shown · non-contrast
Comparison: None.

CLINICAL DATA: Pain

EXAM:
LEFT KNEE - 1-2 VIEW

[knee ap]
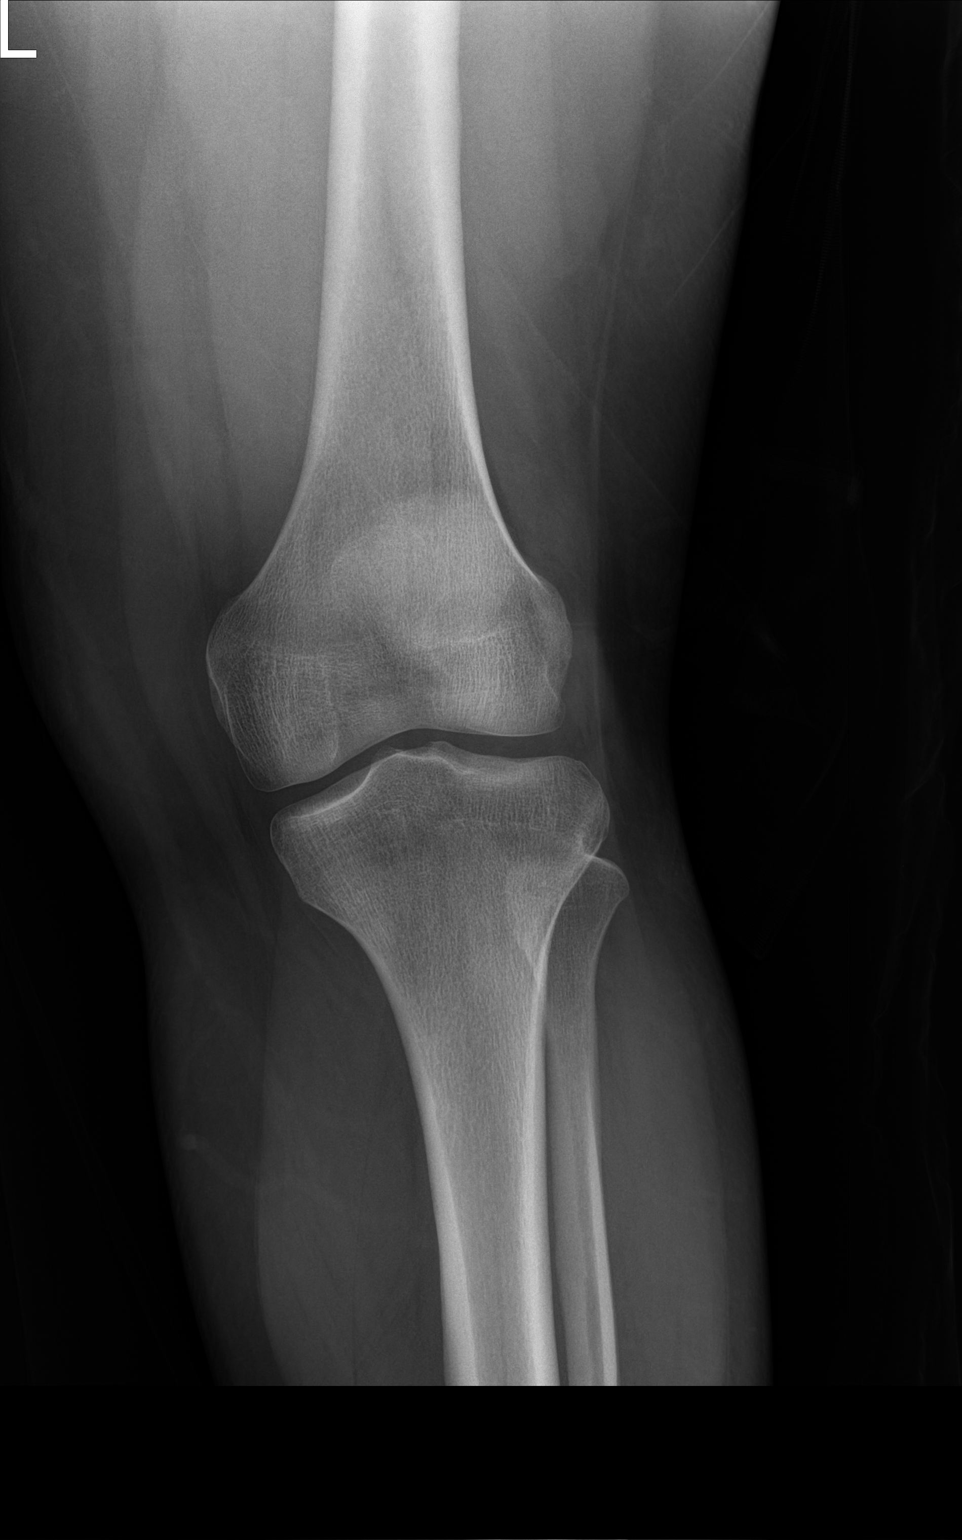

[knee lat]
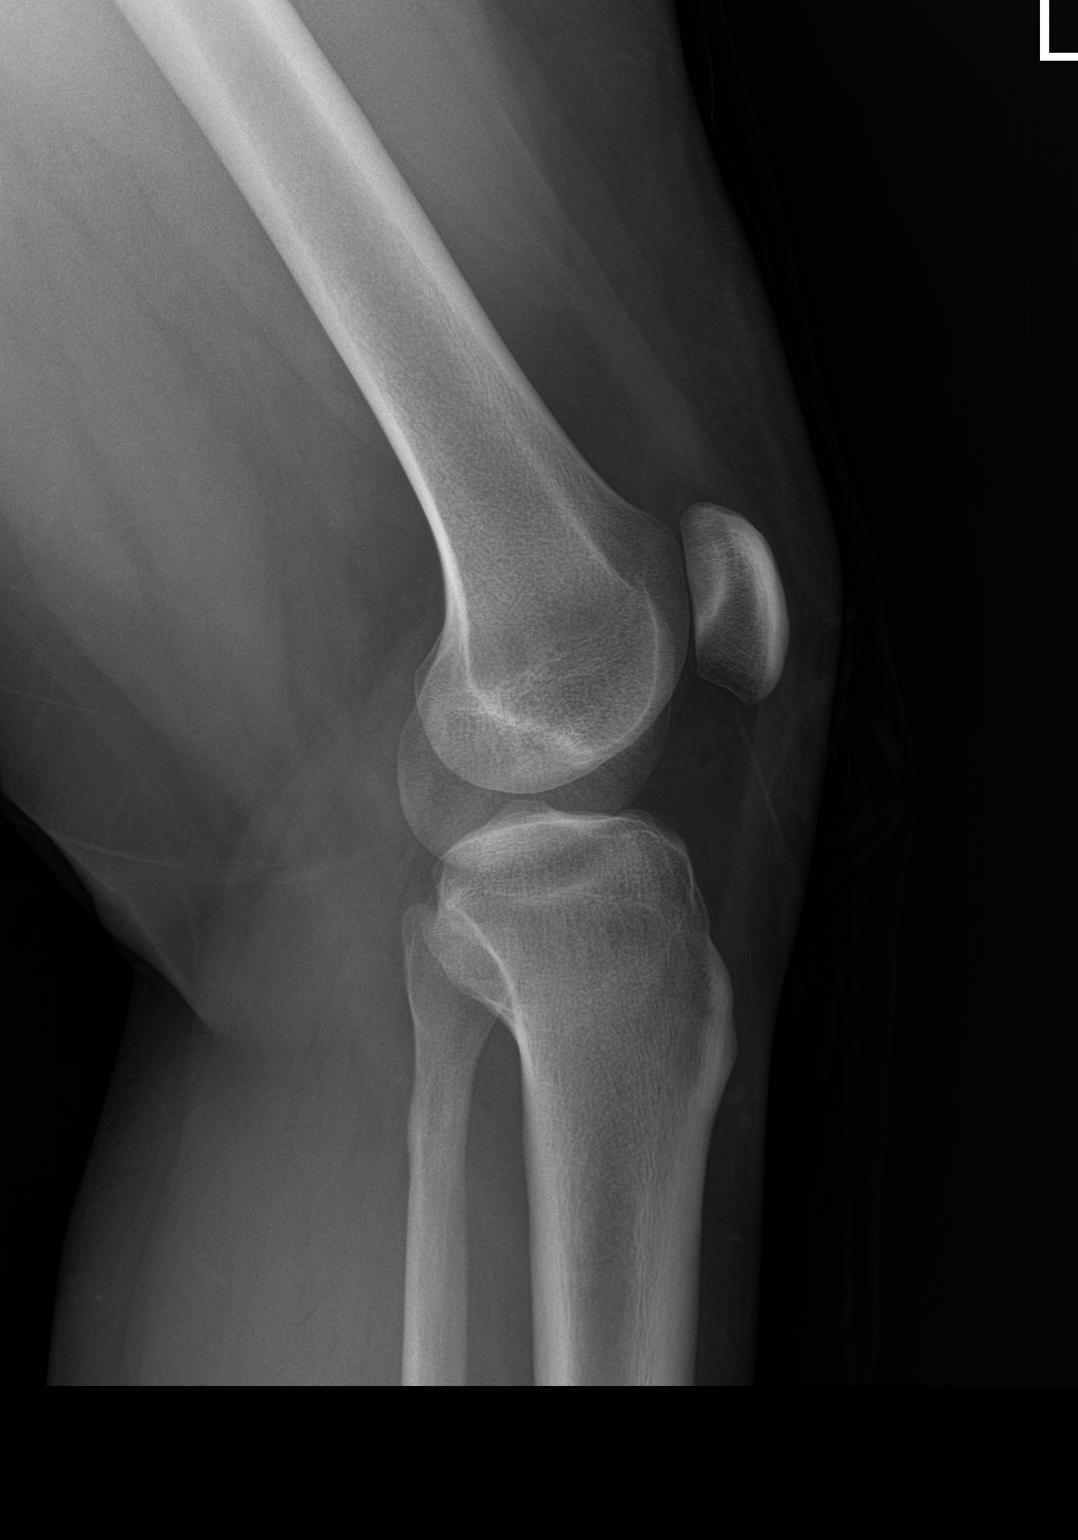

[2 of 2 positions shown; findings below may reference images not displayed]

FINDINGS: Frontal and lateral views were obtained. There is no fracture,
dislocation, or joint effusion. Joint spaces appear normal. No
erosive change.
IMPRESSION: No fracture, dislocation, or joint effusion. No evident arthropathy.

## 2021-08-01 DIAGNOSIS — M25572 Pain in left ankle and joints of left foot: Secondary | ICD-10-CM | POA: Diagnosis not present

## 2021-08-01 DIAGNOSIS — F339 Major depressive disorder, recurrent, unspecified: Secondary | ICD-10-CM | POA: Diagnosis not present

## 2021-08-01 DIAGNOSIS — Z1341 Encounter for autism screening: Secondary | ICD-10-CM | POA: Diagnosis not present

## 2021-08-04 DIAGNOSIS — F840 Autistic disorder: Secondary | ICD-10-CM | POA: Diagnosis not present

## 2021-08-04 DIAGNOSIS — F959 Tic disorder, unspecified: Secondary | ICD-10-CM | POA: Diagnosis not present

## 2021-08-04 DIAGNOSIS — F902 Attention-deficit hyperactivity disorder, combined type: Secondary | ICD-10-CM | POA: Diagnosis not present

## 2021-08-04 DIAGNOSIS — F32A Depression, unspecified: Secondary | ICD-10-CM | POA: Diagnosis not present

## 2021-08-04 DIAGNOSIS — F419 Anxiety disorder, unspecified: Secondary | ICD-10-CM | POA: Diagnosis not present

## 2021-08-05 DIAGNOSIS — F339 Major depressive disorder, recurrent, unspecified: Secondary | ICD-10-CM | POA: Diagnosis not present

## 2021-08-18 ENCOUNTER — Encounter: Payer: Self-pay | Admitting: Family

## 2021-08-18 ENCOUNTER — Ambulatory Visit: Payer: Medicaid Other | Admitting: Family

## 2021-08-18 VITALS — BP 114/64 | HR 76 | Resp 16 | Ht 66.34 in | Wt 324.4 lb

## 2021-08-18 DIAGNOSIS — Z79899 Other long term (current) drug therapy: Secondary | ICD-10-CM | POA: Diagnosis not present

## 2021-08-18 DIAGNOSIS — Z719 Counseling, unspecified: Secondary | ICD-10-CM | POA: Diagnosis not present

## 2021-08-18 DIAGNOSIS — R251 Tremor, unspecified: Secondary | ICD-10-CM

## 2021-08-18 DIAGNOSIS — F819 Developmental disorder of scholastic skills, unspecified: Secondary | ICD-10-CM

## 2021-08-18 DIAGNOSIS — Z7189 Other specified counseling: Secondary | ICD-10-CM | POA: Diagnosis not present

## 2021-08-18 DIAGNOSIS — R278 Other lack of coordination: Secondary | ICD-10-CM

## 2021-08-18 DIAGNOSIS — F419 Anxiety disorder, unspecified: Secondary | ICD-10-CM

## 2021-08-18 DIAGNOSIS — F958 Other tic disorders: Secondary | ICD-10-CM

## 2021-08-18 DIAGNOSIS — F32A Depression, unspecified: Secondary | ICD-10-CM

## 2021-08-18 DIAGNOSIS — Z8669 Personal history of other diseases of the nervous system and sense organs: Secondary | ICD-10-CM

## 2021-08-18 DIAGNOSIS — F902 Attention-deficit hyperactivity disorder, combined type: Secondary | ICD-10-CM

## 2021-08-18 DIAGNOSIS — R6510 Systemic inflammatory response syndrome (SIRS) of non-infectious origin without acute organ dysfunction: Secondary | ICD-10-CM

## 2021-08-18 DIAGNOSIS — Z6841 Body Mass Index (BMI) 40.0 and over, adult: Secondary | ICD-10-CM

## 2021-08-18 MED ORDER — ESCITALOPRAM OXALATE 5 MG PO TABS
5.0000 mg | ORAL_TABLET | Freq: Every day | ORAL | 1 refills | Status: DC
Start: 2021-08-18 — End: 2021-11-10

## 2021-08-18 NOTE — Progress Notes (Signed)
[facility] [address] [place] [address] Dept: [phone] Dept Fax: [phone] Loc: [phone] Loc Fax: [phone]  Medication Check  Patient ID: Henry [name], male  DOB: 2001/01/01, 21 y.o.  MRN: [id number]  Date of Evaluation: 08/18/2021 PCP: Leslie Andrea, MD  Accompanied by:  self Patient Lives with: grandmother  HISTORY/CURRENT STATUS: HPI Patient here by himself for the visit today. Patient interactive and appropriate with provider today. Some changes with stressors and more anxiety in the past few months with small events that are piling up. Last f/u visit 04/07/2021 for current visit. Only taking Synthroid from PCP and OTC medication for pain. No other medication.   EDUCATION/WORK: Job searching and waiting for testing with [employer] [employer] Activities/ Exercise: intermittently with walking more and being outside for activities.   MEDICAL HISTORY: Appetite: Cut out soda and energy drinks along with sugars/carbs MVI/Other:None   Sleep: Bedtime: 2300  Awakens: 0800  Concerns: none in the past 3 months. Initiation/Maintenance/Other: no issues reported  Individual Medical History/ Review of Systems: Changes? :Yes seen by Erroll Luna for initial exam. To see for testing with using the ADOS in August. More stress recently and feeling physically sick from impeding "dread"  Allergies: Patient has no known allergies.  Current Medications: Current Outpatient Medications  Medication Instructions   escitalopram (LEXAPRO) 5 mg, Oral, Daily   hydrOXYzine (ATARAX) 25 mg, Oral, Daily at bedtime   levothyroxine (SYNTHROID) 25 mcg, Oral, Every morning   Medication Side Effects: None  Family Medical/ Social History: Changes? New peer interactions and met through other friends  MENTAL HEALTH: Mental Health Issues: Depression and  Anxiety-more anxiety recently due to recent issues that have mounted into something bigger.   PHYSICAL EXAM; Vitals:  Vitals:   08/18/21 0914  BP: 114/64  Pulse: 76  Resp: 16  Weight: (!) 324 lb 6.4 oz (147.1 kg)  Height: 5' 6.34" (1.685 m)    General Physical Exam: Unchanged from previous exam, date:04/07/2021 Changed:None  DIAGNOSES:    ICD-10-CM   1. ADHD (attention deficit hyperactivity disorder), combined type  F90.2     2. Anxiety and depression  F41.9    F32.A     3. Morbid obesity with body mass index of 50.0-59.9 in adult (HCC)  E66.01    Z68.43     4. SIRS (systemic inflammatory response syndrome) (HCC)  R65.10     5. Tremor of right hand  R25.1     6. 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 Learning difficulty  F81.9     8. Dysgraphia  R27.8     9. History of sleep disorder  Z86.69     10. Medication management  Z79.899     11. Patient counseled  Z71.9     12. Goals of care, counseling/discussion  Z71.89      ASSESSMENT: Henry [name] is a 21 year old male with history of ADHD, L/D, Dysgraphia, Anxiety and Depression. Has not been on any medications to manage his current symptoms. More anxiety recently with smaller events that have mounted into a bigger issues. This is effecting him more and having more Tics from the anxiety. Recent events with life to include friends, mother's death, issues with recent traffic violation, and testing next month for ASD. Still living with GM and helping around the house. Started on Synthroid by the PCP and stopped his Jornay pm. Eating better with  healthier food choices and drinking more water. Trying to stay more active outside and more regular exercise. Sleeping well on a good routine now with no issues reported. Discussion for anxiety and possible treatment needed for symptom control.    RECOMMENDATIONS:  Updates with job search and helping around the house with chores.   Testing for ASD next month and looking at [facility] to  assist with job placement.   Counseling recommended for ongoing emotion dysregulation. Suggested f/u with counselor when attending testing for ASD.  Activity and changes with being more active this summer with outdoor sports.  Eating better with healthier food choices and limiting sugar/carbs daily.   Sleep issues with current anxiety and mounting issues. Better schedule recently with continued sleep hygiene. Nothing at [facility] for initiation recently.   Medication management for anxiety and ADHD. Discussed current issues and anxiety needing treatment.  Counseled medication pharmacokinetics, options, dosage, administration, desired effects, and possible side effects.   Stopped Jornay pm  Lexapro 5 mg daily, # 30 with 1 RF's RX for above e-scribed and sent to pharmacy on record  [facility] [address] [place] [address] Phone: [phone] Fax: [phone]  I discussed the assessment and treatment plan with the patient. The patient was provided an opportunity to ask questions and all were answered. The patient agreed with the plan and demonstrated an understanding of the instructions.  NEXT APPOINTMENT: Return in about 3 months (around 11/18/2021) for f/u visit .  The patient was advised to call back or seek an in-person evaluation if the symptoms worsen or if the condition fails to improve as anticipated. Carolann Littler, NP

## 2021-08-19 DIAGNOSIS — R079 Chest pain, unspecified: Secondary | ICD-10-CM | POA: Diagnosis not present

## 2021-08-19 DIAGNOSIS — E039 Hypothyroidism, unspecified: Secondary | ICD-10-CM | POA: Diagnosis not present

## 2021-08-19 DIAGNOSIS — Z733 Stress, not elsewhere classified: Secondary | ICD-10-CM | POA: Diagnosis not present

## 2021-09-01 DIAGNOSIS — F840 Autistic disorder: Secondary | ICD-10-CM | POA: Insufficient documentation

## 2021-09-16 ENCOUNTER — Other Ambulatory Visit: Payer: Self-pay | Admitting: Licensed Clinical Social Worker

## 2021-09-16 NOTE — Patient Outreach (Signed)
Medicaid Managed Care Social Work Note  09/16/2021 Name:  Henry [name] MRN:  [id number] DOB:  04-Jun-2000  Henry [name] is an 21 y.o. year old male who is a primary patient of [name], MD.  The Medicaid Managed Care Coordination team was consulted for assistance with:  Community Resources  Mental Health Counseling and Resources Disease Management and care coordination needs  Mr. [name] was given information about Medicaid Managed Care Coordination team services today. [name] Patient and Primary Caregiver did not agree to services.  Patient provided Medicaid Managed Care team contact information and advised to reach out if in need of care coordination or care management services.  Primary care provider advised of patient declination of services. Patient and grandmother report that they have a [name] care cooridnator involved already who will complete a home visit with them next week. Grandmother ask that [facility] LCSW sent contact and program information to her by email for her to keep in case they need program in the future. Email sent and education provided.   Engaged with patient  for by telephone forinitial visit in response to referral for case management and/or care coordination services.   Assessments/Interventions:  Review of past medical history, allergies, medications, health status, including review of consultants reports, laboratory and other test data, was performed as part of comprehensive evaluation and provision of chronic care management services.  SDOH: (Social Determinant of Health) assessments and interventions performed:   Advanced Directives Status:  Not addressed in this encounter.  Care Plan                 No Known Allergies  Medications Reviewed Today     Reviewed by Carron Curie, NP (Nurse Practitioner) on 08/18/21 at [phone]  Med List Status: <None>   Medication Order Taking? Sig Documenting Provider Last Dose Status Informant   escitalopram (LEXAPRO) 5 MG tablet [id number] Yes Take 1 tablet (5 mg total) by mouth daily. [name], [name], NP  Active   hydrOXYzine (ATARAX) 25 MG tablet [id number] Yes Take 1 tablet (25 mg total) by mouth at bedtime. [name], [name], NP Taking Active   levothyroxine (SYNTHROID) 25 MCG tablet [id number]  Take 25 mcg by mouth every morning. [provider]  Active             Patient Active Problem List   Diagnosis Date Noted   Plantar fasciitis of left foot 05/05/2021   Positive self-administered antigen test for COVID-19 12/08/2020   Acute upper back pain 05/26/2020   ADHD (attention deficit hyperactivity disorder), combined type 02/03/2020   Pain in both knees 12/09/2019   SIRS (systemic inflammatory response syndrome) (HCC) 05/11/2019   Generalized anxiety disorder 08/26/2018   Tremor of right hand 12/10/2017   Seizure-like activity (HCC) 11/26/2017   Depression, recurrent (HCC) 03/21/2017   Tics of organic origin 03/22/2015   Morbid obesity with body mass index of 50.0-59.9 in adult [facility]) 03/22/2015   Anxiety and depression 02/24/2015   Motor tic disorder 02/24/2015   There are no care plans that you recently modified to display for this patient.   Follow up:  Patient requests no follow-up at this time.  Plan: The Managed Medicaid care management team is available to follow up with the patient after provider conversation with the patient regarding recommendation for care management engagement and subsequent re-referral to the care management team.   Dickie La, BSW, MSW, LCSW Managed Medicaid LCSW [facility]  [name] Phone: [phone]

## 2021-09-16 NOTE — Patient Instructions (Signed)
Visit Information  Mr. Jost was given information about Medicaid Managed Care team care coordination services and did not consent to engagement with the [facility] Managed Care team.   The Managed Medicaid care management team is available to follow up with the patient after provider conversation with the patient regarding recommendation for care management engagement and subsequent re-referral to the care management team.   Gustavus Bryant, LCSW

## 2021-11-10 ENCOUNTER — Ambulatory Visit (INDEPENDENT_AMBULATORY_CARE_PROVIDER_SITE_OTHER): Payer: Medicaid Other | Admitting: Family

## 2021-11-10 ENCOUNTER — Encounter: Payer: Self-pay | Admitting: Family

## 2021-11-10 VITALS — BP 110/64 | HR 74 | Resp 16 | Ht 66.3 in | Wt 322.2 lb

## 2021-11-10 DIAGNOSIS — F419 Anxiety disorder, unspecified: Secondary | ICD-10-CM | POA: Diagnosis not present

## 2021-11-10 DIAGNOSIS — Z6841 Body Mass Index (BMI) 40.0 and over, adult: Secondary | ICD-10-CM

## 2021-11-10 DIAGNOSIS — Z79899 Other long term (current) drug therapy: Secondary | ICD-10-CM

## 2021-11-10 DIAGNOSIS — F840 Autistic disorder: Secondary | ICD-10-CM

## 2021-11-10 DIAGNOSIS — Z719 Counseling, unspecified: Secondary | ICD-10-CM

## 2021-11-10 DIAGNOSIS — F902 Attention-deficit hyperactivity disorder, combined type: Secondary | ICD-10-CM | POA: Diagnosis not present

## 2021-11-10 DIAGNOSIS — F819 Developmental disorder of scholastic skills, unspecified: Secondary | ICD-10-CM

## 2021-11-10 DIAGNOSIS — F958 Other tic disorders: Secondary | ICD-10-CM

## 2021-11-10 DIAGNOSIS — R251 Tremor, unspecified: Secondary | ICD-10-CM

## 2021-11-10 DIAGNOSIS — R278 Other lack of coordination: Secondary | ICD-10-CM

## 2021-11-10 DIAGNOSIS — Z7189 Other specified counseling: Secondary | ICD-10-CM

## 2021-11-10 DIAGNOSIS — F32A Depression, unspecified: Secondary | ICD-10-CM

## 2021-11-10 MED ORDER — ESCITALOPRAM OXALATE 5 MG PO TABS: 5.0000 mg | ORAL_TABLET | Freq: Every day | ORAL | 3 refills | Status: DC

## 2021-11-10 NOTE — Progress Notes (Signed)
[facility] [address] [place] [address] Dept: [phone] Dept Fax: [phone] Loc: [phone] Loc Fax: [phone]  Medication Check  Patient ID: Henry [name], male  DOB: 07/28/2000, 21 y.o.  MRN: [id number]  Date of Evaluation: 11/10/2021 PCP: [name], MD  Accompanied by:  Self Patient Lives with: grandmother  HISTORY/CURRENT STATUS: HPI Patient here by himself for the visit today. Patient interactive and appropriate with provider. Patient with updates for ASD testing and counseling with [facility]. Now attempting to get interventions with life skills and work assistance. Has stopped the Hydroxyzine and ran out of Lexapro 5 mg daily with no side effects.   EDUCATION/WORK: Work: Looking for options  Evaluated by [employer] to [employer] for counseling, but was a negative experience Activities/ Exercise:  running with his dog  MEDICAL HISTORY: Appetite: Good, but has been trying to eat healthier options MVI/Other: None  Sleep: Bedtime: 2300  Awakens: 0700-0800 or 0900 the latest  Concerns: Initiation/Maintenance/Other: None  Individual Medical History/ Review of Systems: Changes? :No, attempting to change PCP to be closer to his house. 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   Allergies: Patient has no known allergies.  Current Medications:  Current Outpatient Medications  Medication Instructions   escitalopram (LEXAPRO) 5 mg, Oral, Daily  Medication Side Effects: None Family Medical/ Social History: Changes? None reported recently.  MENTAL HEALTH: Mental Health Issues: Anxiety-less with Lexapro 5 mg daily  PHYSICAL EXAM; Vitals:  Vitals:   11/10/21 1400  BP: 110/64  Pulse: 74  Resp: 16  Weight: (!) 322 lb 3.2 oz (146.1 kg)  Height: 5' 6.3" (1.684 m)    General Physical Exam: Unchanged from  previous exam, date:08/18/2021 Changed:None  DIAGNOSES:    ICD-10-CM   1. ADHD (attention deficit hyperactivity disorder), combined type  F90.2     2. Anxiety and depression  F41.9    F32.A     3. Morbid obesity with body mass index of 50.0-59.9 in adult (HCC)  E66.01    Z68.43     4. Tremor of right hand  R25.1     5. 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 Autism  F84.0     7. Learning difficulty  F81.9     8. Dysgraphia  R27.8     9. Medication management  Z79.899     10. Patient counseled  Z71.9     11. Goals of care, counseling/discussion  Z71.89      ASSESSMENT: Henry [name] is a 21 year old male with a history of ADHD, Anxiety, L/D and ASD. Has continued with Lexapro 5 mg daily with good efficacy reported. Has been recently evaluated for ASD with positive results. Looking at assistance with socializing and having attended recent group at [facility] with limited success. Patient refusing to return and looking at alternative options for group therapy. Getting assistance from [employer] with work and training for life skills. Staying interactive with friends at least 1 time weekly for a D & D game. Still walking/running with day for exercise. Eating healthy food options and drinking plenty of water. Stopped caffeine and energy drinks. Looking at new PCP due to history of recent interactions. Helping grandfather with [employer] representative and grandmother around the house. Stopped taking the Synthroid due to side effects and no longer using the Hydroxyzine for sleep. Reported sleeping with no concerns with initiation or maintenance. Will continue with Lexapro  5 mg daily for anxiety along with continued progress with social interactions.   RECOMMENDATIONS:  Updates with healthcare, family, ASD testing, and medication with patient.  Completion of [facility] evaluation with the ADOS for ASD with confirmed diagnosis of Autism.   Discussed plans for attending counseling services with recent failure at  [facility].  Supported contacting [facility] for groups and social outings for more interaction.  Suggested continued support with [facility] for life skills and job coaching.  Eating healthier food options and continued exercise encouraged.  Discussed recent health issues with adverse reaction to medication with continued search for new PCP.   Sleep schedule discussed with patient and getting an appropriate amount of sleep each night.   Medication management with current medication reviewed with history of recent heart palpations with Synthroid.  Has done well with Lexapro 5 mg with good report and efficacy.  Counseled medication pharmacokinetics, options, dosage, administration, desired effects, and possible side effects.   Stopped Jornay pm  Lexapro 5 mg daily, # 30 with 3 RF's.RX for above e-scribed and sent to pharmacy on record  [facility] [address] [place] [address] Phone: [phone] Fax: [phone]  I discussed the assessment and treatment plan with the patient. The patient was provided an opportunity to ask questions and all were answered. The patient agreed with the plan and demonstrated an understanding of the instructions.  NEXT APPOINTMENT: Return in about 3 months (around 02/10/2022) for f/u visit .  The patient was advised to call back or seek an in-person evaluation if the symptoms worsen or if the condition fails to improve as anticipated.  Carolann Littler, NP   Counseling Time: 48 mins Total Contact Time: 54 mins

## 2022-02-08 ENCOUNTER — Ambulatory Visit (INDEPENDENT_AMBULATORY_CARE_PROVIDER_SITE_OTHER): Payer: Medicaid Other | Admitting: Family

## 2022-02-08 ENCOUNTER — Encounter: Payer: Self-pay | Admitting: Family

## 2022-02-08 VITALS — BP 116/82 | HR 76 | Resp 16 | Ht 66.3 in | Wt 322.0 lb

## 2022-02-08 DIAGNOSIS — R278 Other lack of coordination: Secondary | ICD-10-CM | POA: Diagnosis not present

## 2022-02-08 DIAGNOSIS — F840 Autistic disorder: Secondary | ICD-10-CM | POA: Diagnosis not present

## 2022-02-08 DIAGNOSIS — F411 Generalized anxiety disorder: Secondary | ICD-10-CM

## 2022-02-08 DIAGNOSIS — F902 Attention-deficit hyperactivity disorder, combined type: Secondary | ICD-10-CM | POA: Diagnosis not present

## 2022-02-08 DIAGNOSIS — F819 Developmental disorder of scholastic skills, unspecified: Secondary | ICD-10-CM | POA: Diagnosis not present

## 2022-02-08 DIAGNOSIS — Z79899 Other long term (current) drug therapy: Secondary | ICD-10-CM

## 2022-02-08 DIAGNOSIS — G2569 Other tics of organic origin: Secondary | ICD-10-CM

## 2022-02-08 DIAGNOSIS — R6510 Systemic inflammatory response syndrome (SIRS) of non-infectious origin without acute organ dysfunction: Secondary | ICD-10-CM

## 2022-02-08 DIAGNOSIS — Z6841 Body Mass Index (BMI) 40.0 and over, adult: Secondary | ICD-10-CM

## 2022-02-08 DIAGNOSIS — F339 Major depressive disorder, recurrent, unspecified: Secondary | ICD-10-CM

## 2022-02-08 DIAGNOSIS — Z719 Counseling, unspecified: Secondary | ICD-10-CM

## 2022-02-08 DIAGNOSIS — Z7189 Other specified counseling: Secondary | ICD-10-CM

## 2022-02-08 NOTE — Patient Instructions (Signed)
Restart Lexapro 5 mg daily  Call to establish care with therapist/counselor

## 2022-02-08 NOTE — Progress Notes (Signed)
[facility] [place] [facility] [address], [address]  [place] [address] Dept: [phone] Dept Fax: [phone] Loc: [phone] Loc Fax: [phone]  Medication Check  Patient ID: Henry [name], male  DOB: Apr 07, 2000, 22 y.o.  MRN: [id number]  Date of Evaluation: 02/08/2022 PCP: Leslie Andrea, MD  Accompanied by:  self Patient Lives with: grandmother  HISTORY/CURRENT STATUS: HPI Patient here by himself for the visit today. Patient interactive and appropriate with provider. Increased changes reported with family dynamics and more anger. No major changes reported since the last f/u visit on 11/10/2021. Not currently taking any medications daily and forgot about his Lexapro.   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 Not currently working but searching for jobs on his own. Applied to [employer] recently and friend works there.   Activities/ Exercise:  Was working out and stopped when he started getting more angry.   MEDICAL HISTORY: Appetite: Better food choices and trying new food items. Learning to cook and more nutrition information. MVI/Other: None  Sleep: Bedtime: 2200-2300  Awakens: 0300 to walk the dog and goes back to sleep.  Wakes up between 0900-1000 for the day after back to sleep. Concerns: Initiation/Maintenance/Other: Interrupted for dog to be walked.   Individual Medical History/ Review of Systems: Changes? :Yes recent abscess of tooth and was prescribed Abx. Met with new dentist yesterday and cleaning on the 29th. Wisdom teeth to be extracted at a later time. Primary care recently for "stiff neck" and prescribed a muscle relaxer.   Allergies: Patient has no known allergies.  Current Medications:  Current Outpatient Medications  Medication Instructions   escitalopram  (LEXAPRO) 5 mg, Oral, Daily  Medication Side Effects: None  Family Medical/ Social History: Changes? Yes brother with drama with his new wife and   MENTAL HEALTH: Mental Health Issues: Anxiety  PHYSICAL EXAM; Vitals:  Vitals:   02/08/22 0952  BP: 116/82  Pulse: 76  Resp: 16  Weight: (!) 322 lb (146.1 kg)  Height: 5' 6.3" (1.684 m)    General Physical Exam: Unchanged from previous exam, date:11/10/2021 Changed:None  DIAGNOSES:    ICD-10-CM   1. ADHD (attention deficit hyperactivity disorder), combined type  F90.2     2. Autism  F84.0     3. Learning difficulty  F81.9     4. Dysgraphia  R27.8     5. SIRS (systemic inflammatory response syndrome) (HCC)  R65.10     6. Generalized anxiety disorder  F41.1     7. Depression, recurrent ([place])  F33.9     8. Morbid obesity with body mass index of 50.0-59.9 in adult (HCC)  E66.01    Z68.43     9. 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 Medication management  Z79.899     11. Patient counseled  Z71.9     12. Goals of care, counseling/discussion  Z71.89     ASSESSMENT: Caron is a 22 year old male with a history of ADHD, Anxiety, L/D and ASD. Has not been on any medications and having issues with anger and anxiety. Still having issues with family dynamics and self esteem. 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 Looking for jobs on his own and networking with friends. Socializing with friends more and exposing himself to more anxiety provoking situations. Not currently getting counseling. Still having issues with  various family members and relationships. Not exercising as much, but still moving. Sleep habits are consistent but waking to walk the dog in the middle of the night. Restart Lexapro 5 mg for anxiety and assisting with his mood.   RECOMMENDATIONS:  Discussed work situation and continuation with job seeking.  The [employer] discussed with patient for vocational support.   Self care  discussed with eating healthy and exercising for weight loss.   Social interactions with outings and recent projects with friends.   Making a list of items to discuss on his phone with therapist   Suggested therapy to discuss on going issues with anger and family dynamics.   Sleep habits discussed with some interruptions with sleep schedule due to dog needing to be walked.   Medication management and dosing of medication discussed. Suggestion of restarting his Lexapro due to anger and anxiety.   Counseled medication pharmacokinetics, options, dosage, administration, desired effects, and possible side effects.    Lexapro 5 mg daily to restart, no Rx today  I discussed the assessment and treatment plan with the patient. The patient was provided an opportunity to ask questions and all were answered. The patient agreed with the plan and demonstrated an understanding of the instructions.   NEXT APPOINTMENT: Return in about 3 months (around 05/10/2022) for f/u visit .  The patient was advised to call back or seek an in-person evaluation if the symptoms worsen or if the condition fails to improve as anticipated   Carolann Littler, NP  Counseling Time: 48 mins Total Contact Time: 52 mins

## 2022-02-20 ENCOUNTER — Telehealth: Payer: Self-pay | Admitting: Family

## 2022-03-23 ENCOUNTER — Encounter: Payer: Self-pay | Admitting: Radiology

## 2022-04-06 ENCOUNTER — Ambulatory Visit (HOSPITAL_COMMUNITY): Payer: No Typology Code available for payment source | Admitting: Clinical

## 2022-04-06 ENCOUNTER — Encounter (HOSPITAL_COMMUNITY): Payer: Self-pay

## 2022-05-25 ENCOUNTER — Encounter (HOSPITAL_COMMUNITY): Payer: Self-pay | Admitting: *Deleted

## 2022-05-25 ENCOUNTER — Emergency Department (HOSPITAL_COMMUNITY)
Admission: EM | Admit: 2022-05-25 | Discharge: 2022-05-25 | Disposition: A | Discharge status: Home / Self Care | Payer: Medicaid Other | Attending: Student | Admitting: Student

## 2022-05-25 ENCOUNTER — Other Ambulatory Visit: Payer: Self-pay

## 2022-05-25 DIAGNOSIS — R1110 Vomiting, unspecified: Secondary | ICD-10-CM | POA: Diagnosis not present

## 2022-05-25 DIAGNOSIS — R1013 Epigastric pain: Secondary | ICD-10-CM | POA: Diagnosis present

## 2022-05-25 DIAGNOSIS — F419 Anxiety disorder, unspecified: Secondary | ICD-10-CM | POA: Insufficient documentation

## 2022-05-25 LAB — COMPREHENSIVE METABOLIC PANEL
ALT: 37 U/L (ref 0–44)
AST: 28 U/L (ref 15–41)
Albumin: 4.1 g/dL (ref 3.5–5.0)
Alkaline Phosphatase: 67 U/L (ref 38–126)
Anion gap: 9 (ref 5–15)
BUN: 10 mg/dL (ref 6–20)
CO2: 24 mmol/L (ref 22–32)
Calcium: 8.9 mg/dL (ref 8.9–10.3)
Chloride: 105 mmol/L (ref 98–111)
Creatinine, Ser: 0.71 mg/dL (ref 0.61–1.24)
GFR, Estimated: >60 mL/min (ref >60)
Glucose, Bld: 97 mg/dL (ref 70–99)
Potassium: 3.6 mmol/L (ref 3.5–5.1)
Sodium: 138 mmol/L (ref 135–145)
Total Bilirubin: 1 mg/dL (ref 0.3–1.2)
Total Protein: 7.5 g/dL (ref 6.5–8.1)

## 2022-05-25 LAB — URINALYSIS, ROUTINE W REFLEX MICROSCOPIC
Bacteria, UA: NONE SEEN
Bilirubin Urine: NEGATIVE
Glucose, UA: NEGATIVE mg/dL
Hgb urine dipstick: NEGATIVE
Ketones, ur: NEGATIVE mg/dL
Leukocytes,Ua: NEGATIVE
Nitrite: NEGATIVE
Protein, ur: 30 mg/dL — AB
Specific Gravity, Urine: 1.027 (ref 1.005–1.030)
pH: 5 (ref 5.0–8.0)

## 2022-05-25 LAB — CBC WITH DIFFERENTIAL/PLATELET
Abs Immature Granulocytes: 0.03 10*3/uL (ref 0.00–0.07)
Basophils Absolute: 0 10*3/uL (ref 0.0–0.1)
Basophils Relative: 0 %
Eosinophils Absolute: 0.2 10*3/uL (ref 0.0–0.5)
Eosinophils Relative: 2 %
HCT: 44.5 % (ref 39.0–52.0)
Hemoglobin: 14.9 g/dL (ref 13.0–17.0)
Immature Granulocytes: 0 %
Lymphocytes Relative: 17 %
Lymphs Abs: 1.2 10*3/uL (ref 0.7–4.0)
MCH: 29.7 pg (ref 26.0–34.0)
MCHC: 33.5 g/dL (ref 30.0–36.0)
MCV: 88.6 fL (ref 80.0–100.0)
Monocytes Absolute: 0.4 10*3/uL (ref 0.1–1.0)
Monocytes Relative: 5 %
Neutro Abs: 5.2 10*3/uL (ref 1.7–7.7)
Neutrophils Relative %: 76 %
Platelets: 284 10*3/uL (ref 150–400)
RBC: 5.02 MIL/uL (ref 4.22–5.81)
RDW: 13.1 % (ref 11.5–15.5)
WBC: 7 10*3/uL (ref 4.0–10.5)
nRBC: 0 % (ref 0.0–0.2)

## 2022-05-25 LAB — LIPASE, BLOOD: Lipase: 27 U/L (ref 11–51)

## 2022-05-25 MED ORDER — FAMOTIDINE 20 MG PO TABS: 20.0000 mg | ORAL_TABLET | Freq: Two times a day (BID) | ORAL | 0 refills | Status: DC

## 2022-05-25 NOTE — ED Triage Notes (Signed)
Pt with abd pain since this morning with waking up, pain had increased and pt with emesis x 1, states blood was noted in his emesis.  Pain has gotten better.  Diarrhea x1 3 days ago, none since.

## 2022-05-26 NOTE — ED Provider Notes (Signed)
[place] EMERGENCY DEPARTMENT AT [facility] Provider Note  CSN: [id number] Arrival date & time: 05/25/22 1034  Chief Complaint(s) Abdominal Pain  HPI Caylan Nunziata is a 22 y.o. male with PMH anxiety, ADHD, depression who presents emergency department for evaluation of abdominal pain.  Patient states that this morning he had a progressive onset epigastric abdominal pain with a single episode of emesis.  Currently asymptomatic here in the emergency department.  He states that he feels this pain whenever he has heightened episodes of stress and anxiety and did feel particularly anxious this morning.  Denies chest pain, shortness of breath, headache, fever or other systemic symptoms.   Past Medical History Past Medical History:  Diagnosis Date   Acne    ADHD (attention deficit hyperactivity disorder)    Anxiety    Depression    Patient Active Problem List   Diagnosis Date Noted   Plantar fasciitis of left foot 05/05/2021   Positive self-administered antigen test for COVID-19 12/08/2020   Acute upper back pain 05/26/2020   ADHD (attention deficit hyperactivity disorder), combined type 02/03/2020   Pain in both knees 12/09/2019   SIRS (systemic inflammatory response syndrome) (HCC) 05/11/2019   Generalized anxiety disorder 08/26/2018   Tremor of right hand 12/10/2017   Seizure-like activity (HCC) 11/26/2017   Depression, recurrent (HCC) 03/21/2017   Tics of organic origin 03/22/2015   Morbid obesity with body mass index of 50.0-59.9 in adult [facility]) 03/22/2015   Anxiety and depression 02/24/2015   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 Authorizing Provider  famotidine (PEPCID) 20 MG tablet Take 1 tablet (20 mg total) by mouth 2 (two) times daily. 05/25/22  Yes Nyal Schachter, MD  escitalopram (LEXAPRO) 5 MG tablet Take 1 tablet (5 mg total) by mouth daily. 11/10/21   [name],  [name], NP                                                                                                                                    Past Surgical History History reviewed. No pertinent surgical history. Family History Family History  Problem Relation Age of Onset   Thyroid disease Mother    Diabetes Other    Heart disease Other     Social History Social History   Tobacco Use   Smoking status: Never    Passive exposure: Never   Smokeless tobacco: Never  Vaping Use   Vaping Use: Never used  Substance Use Topics   Alcohol use: No    Alcohol/week: 0.0 standard drinks of alcohol   Drug use: No   Allergies Patient has no known allergies.  Review of Systems Review of Systems  Gastrointestinal:  Positive for abdominal pain and vomiting.    Physical Exam Vital Signs  I have reviewed the triage vital signs BP 116/73 (BP Location: Right Arm)  Pulse 78   Temp 98.3 F (36.8 C) (Oral)   Resp 16   Ht 5\' 6"  (1.676 m)   Wt (!) 147.4 kg   SpO2 99%   BMI 52.46 kg/m   Physical Exam Constitutional:      General: He is not in acute distress.    Appearance: Normal appearance.  HENT:     Head: Normocephalic and atraumatic.     Nose: No congestion or rhinorrhea.  Eyes:     General:        Right eye: No discharge.        Left eye: No discharge.     Extraocular Movements: Extraocular movements intact.     Pupils: Pupils are equal, round, and reactive to light.  Cardiovascular:     Rate and Rhythm: Normal rate and regular rhythm.     Heart sounds: No murmur heard. Pulmonary:     Effort: No respiratory distress.     Breath sounds: No wheezing or rales.  Abdominal:     General: There is no distension.     Tenderness: There is no abdominal tenderness.  Musculoskeletal:        General: Normal range of motion.     Cervical back: Normal range of motion.  Skin:    General: Skin is warm and dry.  Neurological:     General: No focal deficit present.     Mental  Status: He is alert.     ED Results and Treatments Labs (all labs ordered are listed, but only abnormal results are displayed) Labs Reviewed  URINALYSIS, ROUTINE W REFLEX MICROSCOPIC - Abnormal; Notable for the following components:      Result Value   APPearance HAZY (*)    Protein, ur 30 (*)    All other components within normal limits  CBC WITH DIFFERENTIAL/PLATELET  COMPREHENSIVE METABOLIC PANEL  LIPASE, BLOOD                                                                                                                          Radiology No results found.  Pertinent labs & imaging results that were available during my care of the patient were reviewed by me and considered in my medical decision making (see MDM for details).  Medications Ordered in ED Medications - No data to display  Procedures Procedures  (including critical care time)  Medical Decision Making / ED Course   This patient presents to the ED for concern of abdominal pain, this involves an extensive number of treatment options, and is a complaint that carries with it a high risk of complications and morbidity.  The differential diagnosis includes GERD/gastritis, peptic ulcer disease, pancreatitis, gastroparesis, pneumonia, pleurisy, pericarditis  MDM: Patient seen emergency room for evaluation of epigastric abdominal pain.  Physical exam is unremarkable.  Laboratory evaluation unremarkable.  Given total resolution of symptoms here in the emergency department, benign exam, CT imaging deferred.  Suspect an element of stress-induced gastritis and patient is going to follow-up outpatient with his mental health providers.  Patient given Pepcid to take as needed.  Patient then discharged with outpatient follow-up and return precautions which she voiced understanding.   Additional history  obtained:  -External records from outside source obtained and reviewed including: Chart review including previous notes, labs, imaging, consultation notes   Lab Tests: -I ordered, reviewed, and interpreted labs.   The pertinent results include:   Labs Reviewed  URINALYSIS, ROUTINE W REFLEX MICROSCOPIC - Abnormal; Notable for the following components:      Result Value   APPearance HAZY (*)    Protein, ur 30 (*)    All other components within normal limits  CBC WITH DIFFERENTIAL/PLATELET  COMPREHENSIVE METABOLIC PANEL  LIPASE, BLOOD       Medicines ordered and prescription drug management: Meds ordered this encounter  Medications   famotidine (PEPCID) 20 MG tablet    Sig: Take 1 tablet (20 mg total) by mouth 2 (two) times daily.    Dispense:  30 tablet    Refill:  0    -I have reviewed the patients home medicines and have made adjustments as needed  Critical interventions none    Cardiac Monitoring: The patient was maintained on a cardiac monitor.  I personally viewed and interpreted the cardiac monitored which showed an underlying rhythm of: NSR  Social Determinants of Health:  Factors impacting patients care include: none   Reevaluation: After the interventions noted above, I reevaluated the patient and found that they have :improved  Co morbidities that complicate the patient evaluation  Past Medical History:  Diagnosis Date   Acne    ADHD (attention deficit hyperactivity disorder)    Anxiety    Depression       Dispostion: I considered admission for this patient, but at this time he does not meet inpatient criteria for admission he is safe for discharge with outpatient follow-up     Final Clinical Impression(s) / ED Diagnoses Final diagnoses:  Epigastric pain  Anxiety     @PCDICTATION @    Alban Marucci, Wyn Forster, MD 05/26/22 [phone]

## 2022-05-31 DIAGNOSIS — R251 Tremor, unspecified: Secondary | ICD-10-CM | POA: Insufficient documentation

## 2022-07-06 DIAGNOSIS — E785 Hyperlipidemia, unspecified: Secondary | ICD-10-CM | POA: Insufficient documentation

## 2022-08-17 ENCOUNTER — Encounter: Payer: Self-pay | Admitting: Family Medicine

## 2022-08-17 ENCOUNTER — Ambulatory Visit (INDEPENDENT_AMBULATORY_CARE_PROVIDER_SITE_OTHER): Payer: MEDICAID | Admitting: Family Medicine

## 2022-08-17 VITALS — BP 120/80 | HR 94 | Ht 67.0 in | Wt 321.0 lb

## 2022-08-17 DIAGNOSIS — G43509 Persistent migraine aura without cerebral infarction, not intractable, without status migrainosus: Secondary | ICD-10-CM | POA: Insufficient documentation

## 2022-08-17 DIAGNOSIS — G43909 Migraine, unspecified, not intractable, without status migrainosus: Secondary | ICD-10-CM | POA: Insufficient documentation

## 2022-08-17 DIAGNOSIS — Z0001 Encounter for general adult medical examination with abnormal findings: Secondary | ICD-10-CM | POA: Insufficient documentation

## 2022-08-17 DIAGNOSIS — Z1159 Encounter for screening for other viral diseases: Secondary | ICD-10-CM | POA: Diagnosis not present

## 2022-08-17 DIAGNOSIS — E538 Deficiency of other specified B group vitamins: Secondary | ICD-10-CM | POA: Diagnosis not present

## 2022-08-17 DIAGNOSIS — G43011 Migraine without aura, intractable, with status migrainosus: Secondary | ICD-10-CM | POA: Diagnosis not present

## 2022-08-17 DIAGNOSIS — E785 Hyperlipidemia, unspecified: Secondary | ICD-10-CM

## 2022-08-17 DIAGNOSIS — E559 Vitamin D deficiency, unspecified: Secondary | ICD-10-CM

## 2022-08-17 DIAGNOSIS — G43511 Persistent migraine aura without cerebral infarction, intractable, with status migrainosus: Secondary | ICD-10-CM

## 2022-08-17 MED ORDER — TOPIRAMATE 25 MG PO TABS
25.0000 mg | ORAL_TABLET | Freq: Every evening | ORAL | 3 refills | Status: DC
Start: 2022-08-17 — End: 2022-10-18

## 2022-08-17 NOTE — Assessment & Plan Note (Signed)
Physical exam done, labs ordered  Updated screening and health maintenance  Exercise and nutrition counseling BMI  50.28 Discussed to follow a DASH diet which includes vegetables,fruits,whole grains, fat free or low fat diary,fish,poultry,beans,nuts and seeds,vegetable oils. Find an activity that you will enjoy and start to be active at least 5 days a week for 30 minutes each day.

## 2022-08-17 NOTE — Progress Notes (Deleted)
   New Patient Office Visit   Subjective   Patient ID: Brandyn Lowrey, male    DOB: 12/13/2000  Age: 22 y.o. MRN: [id number]  CC: No chief complaint on file.   HPI Casanova Schurman 22 year old male, presents to establish care. He  has a past medical history of Acne, ADHD (attention deficit hyperactivity disorder), Anxiety, and Depression.  HPI    Outpatient Encounter Medications as of 08/17/2022  Medication Sig   escitalopram (LEXAPRO) 5 MG tablet Take 1 tablet (5 mg total) by mouth daily.   famotidine (PEPCID) 20 MG tablet Take 1 tablet (20 mg total) by mouth 2 (two) times daily.   No facility-administered encounter medications on file as of 08/17/2022.    No past surgical history on file.  ROS    Objective    There were no vitals taken for this visit.  Physical Exam    Assessment & Plan:  There are no diagnoses linked to this encounter.  No follow-ups on file.   Cruzita Lederer Newman Nip, FNP

## 2022-08-17 NOTE — Assessment & Plan Note (Signed)
Patient reports migraines last for days without any relief. And usually puts him out for the rest of day Has tried tylenol and triptans with no help Has about 5-10 migraines a month Trial on Topamax 25 mg once daily for daily  Advise methods include deep breathing, cognitive behavioral therapy focusing on changing thoughts, Limit or avoid alcohol, caffeine, chocolate, canner foods, MSG and aspartame.  Implement sleep hygiene includes not watching TV or listen music in bed, don't eat heavy meals within a couple of hours of bedtime, don't use your phone, laptop, or tablet at bedtime

## 2022-08-17 NOTE — Progress Notes (Signed)
New Patient Office Visit   Subjective   Patient ID: Henry [name], male    DOB: Mar 26, 2000  Age: 22 y.o. MRN: [id number]  CC:  Chief Complaint  Patient presents with   Establish Care    Patient is here to establish care.     HPI Henry [name] 22 year old male presents to establish care. He  has a past medical history of Acne, ADHD (attention deficit hyperactivity disorder), Anxiety, and Depression.  Migraine  This is a chronic problem. The problem occurs intermittently. The problem has been gradually worsening. The pain is located in the Bilateral, temporal and occipital region. The pain does not radiate. The quality of the pain is described as pulsating. The pain is at a severity of 10/10. Associated symptoms include dizziness, a loss of balance, photophobia and a visual change. Pertinent negatives include no abdominal pain, fever or scalp tenderness. The symptoms are aggravated by bright light, noise and activity. He has tried antidepressants, NSAIDs and triptans for the symptoms. The treatment provided no relief. His past medical history is significant for migraine headaches and migraines in the family.      Outpatient Encounter Medications as of 08/17/2022  Medication Sig   topiramate (TOPAMAX) 25 MG tablet Take 1 tablet (25 mg total) by mouth every evening.   escitalopram (LEXAPRO) 5 MG tablet Take 1 tablet (5 mg total) by mouth daily.   famotidine (PEPCID) 20 MG tablet Take 1 tablet (20 mg total) by mouth 2 (two) times daily.   No facility-administered encounter medications on file as of 08/17/2022.    History reviewed. No pertinent surgical history.  Review of Systems  Constitutional:  Negative for chills and fever.  Eyes:  Positive for photophobia.  Respiratory:  Negative for shortness of breath.   Cardiovascular:  Negative for chest pain.  Gastrointestinal:  Negative for abdominal pain.  Genitourinary:  Negative for dysuria.  Neurological:  Positive for dizziness,  headaches and loss of balance.      Objective    BP 120/80   Pulse 94   Ht 5\' 7"  (1.702 m)   Wt (!) 321 lb (145.6 kg)   SpO2 96%   BMI 50.28 kg/m   Physical Exam Vitals reviewed.  Constitutional:      General: He is not in acute distress.    Appearance: Normal appearance. He is not ill-appearing, toxic-appearing or diaphoretic.  HENT:     Head: Normocephalic.  Eyes:     General:        Right eye: No discharge.        Left eye: No discharge.     Conjunctiva/sclera: Conjunctivae normal.  Cardiovascular:     Rate and Rhythm: Normal rate.     Pulses: Normal pulses.     Heart sounds: Normal heart sounds.  Pulmonary:     Effort: Pulmonary effort is normal. No respiratory distress.     Breath sounds: Normal breath sounds.  Abdominal:     General: Bowel sounds are normal.     Palpations: Abdomen is soft.     Tenderness: There is no abdominal tenderness. There is no right CVA tenderness, left CVA tenderness or guarding.  Musculoskeletal:        General: Normal range of motion.     Cervical back: Normal range of motion.  Skin:    General: Skin is warm and dry.     Capillary Refill: Capillary refill takes less than 2 seconds.  Neurological:     General: No  focal deficit present.     Mental Status: He is alert and oriented to person, place, and time.     Coordination: Coordination normal.     Gait: Gait normal.  Psychiatric:        Mood and Affect: Mood normal.        Behavior: Behavior normal.        Thought Content: Thought content normal.        Judgment: Judgment normal.       Assessment & Plan:  Intractable migraine without aura and with status migrainosus -     Topiramate; Take 1 tablet (25 mg total) by mouth every evening.  Dispense: 30 tablet; Refill: 3  Vitamin B12 deficiency -     Vitamin B12  Vitamin D deficiency -     VITAMIN D 25 Hydroxy (Vit-D Deficiency, Fractures)  Need for hepatitis C screening test -     Hepatitis C  antibody  Dyslipidemia Assessment & Plan: Discussed to start lifestyle modifications follow diet low in saturated fat, reduce dietary salt intake, avoid fatty foods, maintain an exercise routine 3 to 5 days a week for a minimum total of 150 minutes.    Intractable persistent migraine aura without cerebral infarction and with status migrainosus Assessment & Plan: Patient reports migraines last for days without any relief. And usually puts him out for the rest of day Has tried tylenol and triptans with no help Has about 5-10 migraines a month Trial on Topamax 25 mg once daily for daily  Advise methods include deep breathing, cognitive behavioral therapy focusing on changing thoughts, Limit or avoid alcohol, caffeine, chocolate, canner foods, MSG and aspartame.  Implement sleep hygiene includes not watching TV or listen music in bed, don't eat heavy meals within a couple of hours of bedtime, don't use your phone, laptop, or tablet at bedtime       Return in about 2 months (around 10/18/2022), or if symptoms worsen or fail to improve, for Migraines .   Cruzita Lederer Newman Nip, FNP

## 2022-08-17 NOTE — Patient Instructions (Signed)

## 2022-08-17 NOTE — Assessment & Plan Note (Signed)
Discussed to start lifestyle modifications follow diet low in saturated fat, reduce dietary salt intake, avoid fatty foods, maintain an exercise routine 3 to 5 days a week for a minimum total of 150 minutes.

## 2022-08-22 NOTE — Addendum Note (Signed)
Addended by: Rica Records on: 08/22/2022 09:01 PM   Modules accepted: Level of Service

## 2022-10-03 ENCOUNTER — Telehealth (INDEPENDENT_AMBULATORY_CARE_PROVIDER_SITE_OTHER): Payer: Self-pay | Admitting: Child and Adolescent Psychiatry

## 2022-10-03 NOTE — Telephone Encounter (Signed)
  Name of who is calling: Lawanda   Caller's Relationship to Patient: Dagoberto Reef at [employer]   Best contact number: [phone]   Provider they see: [facility]  Reason for call: Rowan Blase from [employer] Hearings office called to let us know that they had not received a complete fax of the medical records request from 07/07/2022. They had only received 3 pages and need the full record for a hearing with a judge on Friday 10/06/2022. The office requests they be resent.   FAX: [phone]

## 2022-10-03 NOTE — Telephone Encounter (Signed)
Records have been sent again.

## 2022-10-04 ENCOUNTER — Telehealth: Payer: Self-pay | Admitting: Family Medicine

## 2022-10-04 NOTE — Telephone Encounter (Signed)
Patient called in requesting note.  Patient is needing note stating that his Autism is the cause of him being unable to work. 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  Paperwork is due on 9/15  Patient would like a call back when note is ready for pick up.

## 2022-10-05 ENCOUNTER — Encounter: Payer: Self-pay | Admitting: Family Medicine

## 2022-10-12 NOTE — Telephone Encounter (Signed)
Pt called back in regard to previous tele message  Patient is needing note from provider

## 2022-10-17 ENCOUNTER — Encounter: Payer: Self-pay | Admitting: Family Medicine

## 2022-10-18 ENCOUNTER — Encounter: Payer: Self-pay | Admitting: Family Medicine

## 2022-10-18 ENCOUNTER — Ambulatory Visit: Payer: MEDICAID | Admitting: Family Medicine

## 2022-10-18 VITALS — BP 114/72 | HR 73 | Ht 67.0 in | Wt 321.0 lb

## 2022-10-18 DIAGNOSIS — G43009 Migraine without aura, not intractable, without status migrainosus: Secondary | ICD-10-CM

## 2022-10-18 MED ORDER — PROPRANOLOL HCL 10 MG PO TABS
10.0000 mg | ORAL_TABLET | Freq: Two times a day (BID) | ORAL | 2 refills | Status: DC | PRN
Start: 2022-10-18 — End: 2022-10-18

## 2022-10-18 MED ORDER — PROPRANOLOL HCL 10 MG PO TABS
10.0000 mg | ORAL_TABLET | Freq: Three times a day (TID) | ORAL | 2 refills | Status: DC | PRN
Start: 2022-10-18 — End: 2023-08-14

## 2022-10-18 NOTE — Assessment & Plan Note (Signed)
Trial on Propranolol 10 mg  Referral placed to Neurology  Advise to Identify and Avoid Triggers Common Triggers: Stress, certain foods (e.g., caffeine, chocolate, aged cheese, alcohol), irregular sleep patterns, bright lights, loud sounds, and hormonal changes. Tracking: Keep a migraine diary to track patterns and identify specific triggers Stay Hydrated: Dehydration is a common trigger for migraines. Drink plenty of water throughout the day. Balanced Meals: Avoid skipping meals and maintain a diet rich in whole grains, fruits, and vegetables. Eat small, frequent meals to keep blood sugar levels stable. Caffeine: Monitor your caffeine intake. For some, it may help reduce migraines, while for others, it could be a trigger.

## 2022-10-18 NOTE — Progress Notes (Signed)
Patient Office Visit   Subjective   Patient ID: Henry [name], male    DOB: 02-25-2000  Age: 22 y.o. MRN: [id number]  CC:  Chief Complaint  Patient presents with   Follow-up    HPI Henry [name] 22 year old male, presents to the clinic for worsening migraines. He  has a past medical history of Acne, ADHD (attention deficit hyperactivity disorder), Anxiety, and Depression.  Migraine  This is a recurrent problem. The problem occurs intermittently. The problem has been gradually worsening. The pain is located in the Bilateral, occipital and frontal region. The pain does not radiate. The quality of the pain is described as throbbing, pulsating and sharp. The pain is at a severity of 9/10. Associated symptoms include blurred vision, dizziness, eye pain, insomnia, a loss of balance, photophobia, scalp tenderness, tinnitus and a visual change. Pertinent negatives include no seizures. The symptoms are aggravated by bright light, noise and activity. He has tried triptans, antidepressants, NSAIDs and Excedrin for the symptoms. His past medical history is significant for migraine headaches and migraines in the family. There is no history of hypertension or recent head traumas.      Outpatient Encounter Medications as of 10/18/2022  Medication Sig   [DISCONTINUED] propranolol (INDERAL) 10 MG tablet Take 1 tablet (10 mg total) by mouth every 12 (twelve) hours as needed.   propranolol (INDERAL) 10 MG tablet Take 1 tablet (10 mg total) by mouth every 8 (eight) hours as needed.   [DISCONTINUED] escitalopram (LEXAPRO) 5 MG tablet Take 1 tablet (5 mg total) by mouth daily. (Patient not taking: Reported on 10/18/2022)   [DISCONTINUED] famotidine (PEPCID) 20 MG tablet Take 1 tablet (20 mg total) by mouth 2 (two) times daily.   [DISCONTINUED] topiramate (TOPAMAX) 25 MG tablet Take 1 tablet (25 mg total) by mouth every evening. (Patient not taking: Reported on 10/18/2022)   No facility-administered encounter  medications on file as of 10/18/2022.    No past surgical history on file.  Review of Systems  HENT:  Positive for tinnitus.   Eyes:  Positive for blurred vision, photophobia and pain.  Neurological:  Positive for dizziness and loss of balance. Negative for seizures.  Psychiatric/Behavioral:  The patient has insomnia.       Objective    BP 114/72   Pulse 73   Ht 5\' 7"  (1.702 m)   Wt (!) 321 lb (145.6 kg)   SpO2 96%   BMI 50.28 kg/m   Physical Exam Vitals reviewed.  Constitutional:      General: He is not in acute distress.    Appearance: Normal appearance. He is not ill-appearing, toxic-appearing or diaphoretic.  HENT:     Head: Normocephalic.  Eyes:     General:        Right eye: No discharge.        Left eye: No discharge.     Conjunctiva/sclera: Conjunctivae normal.     Pupils: Pupils are equal, round, and reactive to light.  Cardiovascular:     Rate and Rhythm: Normal rate.     Pulses: Normal pulses.     Heart sounds: Normal heart sounds.  Pulmonary:     Effort: Pulmonary effort is normal. No respiratory distress.     Breath sounds: Normal breath sounds.  Abdominal:     General: Bowel sounds are normal.     Palpations: Abdomen is soft.     Tenderness: There is no abdominal tenderness. There is no right CVA tenderness, left CVA tenderness  or guarding.  Musculoskeletal:        General: Normal range of motion.     Cervical back: Normal range of motion.  Skin:    General: Skin is warm and dry.  Neurological:     Mental Status: He is alert.     Coordination: Coordination normal.     Gait: Gait normal.  Psychiatric:        Mood and Affect: Mood normal.       Assessment & Plan:  Migraine without aura and without status migrainosus, not intractable Assessment & Plan: Trial on Propranolol 10 mg  Referral placed to Neurology  Advise to Identify and Avoid Triggers Common Triggers: Stress, certain foods (e.g., caffeine, chocolate, aged cheese, alcohol),  irregular sleep patterns, bright lights, loud sounds, and hormonal changes. Tracking: Keep a migraine diary to track patterns and identify specific triggers Stay Hydrated: Dehydration is a common trigger for migraines. Drink plenty of water throughout the day. Balanced Meals: Avoid skipping meals and maintain a diet rich in whole grains, fruits, and vegetables. Eat small, frequent meals to keep blood sugar levels stable. Caffeine: Monitor your caffeine intake. For some, it may help reduce migraines, while for others, it could be a trigger.  Orders: -     Ambulatory referral to Neurology -     Propranolol HCl; Take 1 tablet (10 mg total) by mouth every 8 (eight) hours as needed.  Dispense: 30 tablet; Refill: 2    Return in about 6 months (around 04/17/2023), or if symptoms worsen or fail to improve, for hyperlipidemia.   Cruzita Lederer Newman Nip, FNP

## 2022-10-20 ENCOUNTER — Telehealth: Payer: Self-pay | Admitting: Family Medicine

## 2022-10-20 NOTE — Telephone Encounter (Signed)
Lmtrc

## 2022-10-20 NOTE — Telephone Encounter (Signed)
Chelsea from [employer] calling regarding letter that was sent to her that stated pt has autism and cannot work? Needing more clarification on that- please advise [phone] ext [phone] Thank you

## 2022-12-25 ENCOUNTER — Encounter: Payer: Self-pay | Admitting: Family Medicine

## 2022-12-25 ENCOUNTER — Telehealth: Payer: Self-pay

## 2022-12-25 NOTE — Telephone Encounter (Signed)
Thank you. Note has been printed for pt.

## 2022-12-25 NOTE — Telephone Encounter (Unsigned)
Copied from CRM [id number]. Topic: General - Other >> Dec 25, 2022  2:53 PM Prudencio Pair wrote: Reason for CRM: Patient's grandmother, [name], is wanting to know if provider can write a re-certification letter for pt stating that he is unable to work due to disability & autism. She states they need this for their apartment re-certification. She's wanting to try to pick it up tomorrow(12/26/22) but advised provider may not get to it before then. [name] stated that is fine & just whenever the provider can get to it. She stated they are due for their re-certification anytime this month and she will need it before then.

## 2022-12-25 NOTE — Telephone Encounter (Signed)
[  Clinic/Practice Name] [Address] [place], [place], ZIP Code] [Phone Number] [Date]  To Whom It May Concern,  Re: [Patient's Full Name], [Date of Birth]  I am writing on behalf of my patient, [Patient's Full Name], who is under my care at [facility] Name]. This letter serves to confirm that [Patient's First Name] is unable to work due to medical conditions, specifically disability and autism spectrum disorder (ASD).  These conditions significantly impact [his/her/their] ability to perform essential job functions, including [list specific limitations if appropriate, e.g., communication challenges, sensory sensitivities, difficulty with sustained focus, or other relevant limitations].  It is my professional medical opinion that [Patient's First Name]'s current health status prevents [him/her/them] from engaging in regular employment at this time. I support [his/her/their] request for accommodations or benefits to assist in managing this disability and maintaining a suitable quality of life.  Please feel free to contact my office at [facility] Number] if further information or clarification is needed.  Sincerely, Vian Fluegel Del Newman Nip, FNP-BC [facility]

## 2023-02-08 ENCOUNTER — Encounter: Payer: Self-pay | Admitting: Neurology

## 2023-02-08 ENCOUNTER — Ambulatory Visit (INDEPENDENT_AMBULATORY_CARE_PROVIDER_SITE_OTHER): Payer: MEDICAID | Admitting: Neurology

## 2023-02-08 VITALS — BP 120/76 | HR 84 | Ht 67.0 in | Wt 325.0 lb

## 2023-02-08 DIAGNOSIS — G43709 Chronic migraine without aura, not intractable, without status migrainosus: Secondary | ICD-10-CM | POA: Diagnosis not present

## 2023-02-08 MED ORDER — ONDANSETRON 4 MG PO TBDP: 4.0000 mg | ORAL_TABLET | Freq: Three times a day (TID) | ORAL | 6 refills | Status: DC | PRN

## 2023-02-08 MED ORDER — LAMOTRIGINE 25 MG PO TABS: ORAL_TABLET | ORAL | 0 refills | Status: DC

## 2023-02-08 MED ORDER — RIZATRIPTAN BENZOATE 10 MG PO TBDP
10.0000 mg | ORAL_TABLET | ORAL | 6 refills | Status: DC | PRN
Start: 2023-02-08 — End: 2023-08-14

## 2023-02-08 MED ORDER — TIZANIDINE HCL 4 MG PO TABS: 4.0000 mg | ORAL_TABLET | Freq: Four times a day (QID) | ORAL | 6 refills | Status: AC | PRN

## 2023-02-08 MED ORDER — LAMOTRIGINE 100 MG PO TABS: 100.0000 mg | ORAL_TABLET | Freq: Every evening | ORAL | 11 refills | Status: DC

## 2023-02-08 NOTE — Progress Notes (Signed)
Chief Complaint  Patient presents with   New Patient (Initial Visit)    Pt in 14, here with grandmother Henry [name]  Pt is referred by Henreitta Leber NP for migraines. Pt states he is having  2-3 migraines. Pt reports pressure behind the eyes, sound and light sensitivity, nausea and vomit at times.       ASSESSMENT AND PLAN  Henry [name] is a 23 y.o. male   Chronic migraine  Anxiety  Will trial lamotrigine titrating to 100 mg every night as preventive medication  Maxalt as needed, may combine with Zofran, tizanidine for severe pounding headaches,  DIAGNOSTIC DATA (LABS, IMAGING, TESTING) - I reviewed patient records, labs, notes, testing and imaging myself where available.   MEDICAL HISTORY:  Henry [name] is a 23 year old male, seen in request by his primary care nurse practitioner Henry [name] for evaluation of migraine, initial evaluation was February 08, 2023    History is obtained from the patient and review of electronic medical records. I personally reviewed pertinent available imaging films in PACS.   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  PHYSICAL EXAM:   Vitals:   02/08/23 1424  BP: 120/76   Pulse: 84  Weight: (!) 325 lb (147.4 kg)  Height: 5\' 7"  (1.702 m)   Not recorded     Body mass index is 50.9 kg/m.  PHYSICAL EXAMNIATION:  Gen: NAD, conversant, well nourised, well groomed                     Cardiovascular: Regular rate rhythm, no peripheral edema, warm, nontender. Eyes: Conjunctivae clear without exudates or hemorrhage Neck: Supple, no carotid bruits. Pulmonary: Clear to auscultation bilaterally   NEUROLOGICAL EXAM:  MENTAL STATUS: Speech/cognition: Awake, alert, oriented to history taking and casual conversation CRANIAL NERVES: CN II: Visual fields are full to confrontation. Pupils are round equal and briskly reactive to light. CN III, IV, VI: extraocular movement are normal. No ptosis. CN V: Facial sensation is intact to light touch CN VII: Face is symmetric with normal eye closure  CN VIII: Hearing is normal to causal conversation. CN IX, X: Phonation is normal. CN XI: Head turning and shoulder shrug are intact  MOTOR: There is no pronator drift of out-stretched arms. Muscle bulk and tone are normal. Muscle strength is normal.  REFLEXES: Reflexes are 2+ and symmetric at the biceps, triceps, knees, and ankles. Plantar responses are flexor.  SENSORY: Intact to light touch, pinprick and vibratory sensation are intact in fingers and toes.  COORDINATION: There is no trunk or limb dysmetria noted.  GAIT/STANCE: Posture is normal. Gait is steady with normal steps, base, arm swing, and turning. Heel and toe  walking are normal. Tandem gait is normal.  Romberg is absent.  REVIEW OF SYSTEMS:  Full 14 system review of systems performed and notable only for as above All other review of systems were negative.   ALLERGIES: No Known Allergies  HOME MEDICATIONS: Current Outpatient Medications  Medication Sig Dispense Refill   propranolol (INDERAL) 10 MG tablet Take 1 tablet (10 mg total) by mouth every 8 (eight) hours as needed. 30 tablet 2   No  current facility-administered medications for this visit.    PAST MEDICAL HISTORY: Past Medical History:  Diagnosis Date   Acne    ADHD (attention deficit hyperactivity disorder)    Anxiety    Depression     PAST SURGICAL HISTORY: History reviewed. No pertinent surgical history.  FAMILY HISTORY: Family History  Problem Relation Age of Onset   Migraines Mother    Thyroid disease Mother    Thyroid cancer Mother    Migraines Paternal Grandmother    Diabetes Other    Heart disease Other     SOCIAL HISTORY: Social History   Socioeconomic History   Marital status: Single    Spouse name: Not on file   Number of children: Not on file   Years of education: Not on file   Highest education level: Not on file  Occupational History   Not on file  Tobacco Use   Smoking status: Never    Passive exposure: Never   Smokeless tobacco: Never  Vaping Use   Vaping status: Never Used  Substance and Sexual Activity   Alcohol use: No    Alcohol/week: 0.0 standard drinks of alcohol   Drug use: No   Sexual activity: Never  Other Topics Concern   Not on file  Social History Narrative   ** Merged History Encounter **       He lives with both parents and he has 1 sister. He enjoys playing video games and he loves history class   Social Drivers of Corporate investment banker Strain: Medium Risk (05/31/2022)   [facility]   Overall Financial Resource Strain (CARDIA)    Difficulty of Paying Living Expenses: Somewhat hard  Food Insecurity: No Food Insecurity (05/31/2022)   Received from [facility]   Hunger Vital Sign    Worried About Running Out of Food in the Last Year: Never true    Ran Out of Food in the Last Year: Never true  Transportation Needs: No Transportation Needs (05/31/2022)   Received from [facility] - Transportation    Lack of Transportation (Medical): No    Lack of Transportation (Non-Medical): No  Physical Activity: Not on file   Stress: Not on file  Social Connections: Not on file  Intimate Partner Violence: Not At Risk (05/31/2022)   Received from [facility]   Humiliation, Afraid, Rape, and Kick questionnaire    Fear of Current or Ex-Partner: No    Emotionally Abused: No    Physically Abused: No    Sexually Abused: No      Levert Feinstein, M.D. Ph.D.  [facility] [address] [place], [place] [address] Ph: [phone] Fax: [phone]  CC:  Rica Records, FNP [id number] [address] [place],  [place] [address]  Henry Nigel Berthold, FNP

## 2023-02-08 NOTE — Patient Instructions (Signed)
Meds ordered this encounter  Medications   lamoTRIgine (LAMICTAL) 25 MG tablet    Sig: One at bedtime xone week 2 tabs at bedtime xone week 3 tabs at bedtime xone week    Dispense:  42 tablet    Refill:  0   rizatriptan (MAXALT-MLT) 10 MG disintegrating tablet    Sig: Take 1 tablet (10 mg total) by mouth as needed. May repeat in 2 hours if needed    Dispense:  12 tablet    Refill:  6   ondansetron (ZOFRAN-ODT) 4 MG disintegrating tablet    Sig: Take 1 tablet (4 mg total) by mouth every 8 (eight) hours as needed for nausea or vomiting.    Dispense:  20 tablet    Refill:  6   tiZANidine (ZANAFLEX) 4 MG tablet    Sig: Take 1 tablet (4 mg total) by mouth every 6 (six) hours as needed for muscle spasms.    Dispense:  30 tablet    Refill:  6    Aleve as needed.

## 2023-02-13 ENCOUNTER — Other Ambulatory Visit: Payer: Self-pay

## 2023-02-13 ENCOUNTER — Encounter (HOSPITAL_COMMUNITY): Payer: Self-pay

## 2023-02-13 ENCOUNTER — Emergency Department (HOSPITAL_COMMUNITY)
Admission: EM | Admit: 2023-02-13 | Discharge: 2023-02-13 | Disposition: A | Discharge status: Home / Self Care | Payer: MEDICAID | Attending: Emergency Medicine | Admitting: Emergency Medicine

## 2023-02-13 DIAGNOSIS — D72829 Elevated white blood cell count, unspecified: Secondary | ICD-10-CM | POA: Diagnosis not present

## 2023-02-13 DIAGNOSIS — R531 Weakness: Secondary | ICD-10-CM | POA: Insufficient documentation

## 2023-02-13 DIAGNOSIS — Z20822 Contact with and (suspected) exposure to covid-19: Secondary | ICD-10-CM | POA: Diagnosis not present

## 2023-02-13 DIAGNOSIS — R197 Diarrhea, unspecified: Secondary | ICD-10-CM | POA: Insufficient documentation

## 2023-02-13 DIAGNOSIS — R112 Nausea with vomiting, unspecified: Secondary | ICD-10-CM | POA: Insufficient documentation

## 2023-02-13 LAB — RESP PANEL BY RT-PCR (RSV, FLU A&B, COVID)  RVPGX2
Influenza A by PCR: NEGATIVE
Influenza B by PCR: NEGATIVE
Resp Syncytial Virus by PCR: NEGATIVE
SARS Coronavirus 2 by RT PCR: NEGATIVE

## 2023-02-13 LAB — CBC
HCT: 47.2 % (ref 39.0–52.0)
Hemoglobin: 15.5 g/dL (ref 13.0–17.0)
MCH: 29.6 pg (ref 26.0–34.0)
MCHC: 32.8 g/dL (ref 30.0–36.0)
MCV: 90.2 fL (ref 80.0–100.0)
Platelets: 242 10*3/uL (ref 150–400)
RBC: 5.23 MIL/uL (ref 4.22–5.81)
RDW: 12.7 % (ref 11.5–15.5)
WBC: 11.4 10*3/uL — ABNORMAL HIGH (ref 4.0–10.5)
nRBC: 0 % (ref 0.0–0.2)

## 2023-02-13 LAB — COMPREHENSIVE METABOLIC PANEL
ALT: 26 U/L (ref 0–44)
AST: 21 U/L (ref 15–41)
Albumin: 4.4 g/dL (ref 3.5–5.0)
Alkaline Phosphatase: 69 U/L (ref 38–126)
Anion gap: 13 (ref 5–15)
BUN: 12 mg/dL (ref 6–20)
CO2: 24 mmol/L (ref 22–32)
Calcium: 9.1 mg/dL (ref 8.9–10.3)
Chloride: 101 mmol/L (ref 98–111)
Creatinine, Ser: 0.78 mg/dL (ref 0.61–1.24)
GFR, Estimated: >60 mL/min (ref >60)
Glucose, Bld: 100 mg/dL — ABNORMAL HIGH (ref 70–99)
Potassium: 3.5 mmol/L (ref 3.5–5.1)
Sodium: 138 mmol/L (ref 135–145)
Total Bilirubin: 1.3 mg/dL — ABNORMAL HIGH (ref 0.0–1.2)
Total Protein: 7.9 g/dL (ref 6.5–8.1)

## 2023-02-13 LAB — LIPASE, BLOOD: Lipase: 28 U/L (ref 11–51)

## 2023-02-13 MED ORDER — ONDANSETRON 4 MG PO TBDP
4.0000 mg | ORAL_TABLET | Freq: Once | ORAL | Status: DC
  Filled 2023-02-13: qty 1

## 2023-02-13 NOTE — ED Triage Notes (Signed)
Pt reports he took Maxalt today for the first time and now he has vomiting and diarrhea and he is not sure if its the medicine.

## 2023-02-13 NOTE — ED Provider Notes (Signed)
Henry [name] EMERGENCY DEPARTMENT AT [facility] Provider Note   CSN: [id number] Arrival date & time: 02/13/23  2005     History  Chief Complaint  Patient presents with   Emesis   Diarrhea    Henry [name] is a 23 y.o. male.  The history is provided by the patient.   Patient w/history of migraines and obesity presents with abrupt onset of vomiting and diarrhea.  He reports this started several hours ago and has had multiple episodes of both.  No blood in his stool, but did report small amount of blood in his vomitus.  No fevers.  He had some abdominal pain that has resolved.  He reports generalized weakness.  No chest pain or cough is reported.  No recent foreign travel.  He did just start Maxalt for headaches    Home Medications Prior to Admission medications   Medication Sig Start Date End Date Taking? Authorizing Provider  lamoTRIgine (LAMICTAL) 100 MG tablet Take 1 tablet (100 mg total) by mouth at bedtime. 02/08/23   Levert Feinstein, MD  lamoTRIgine (LAMICTAL) 25 MG tablet One at bedtime xone week 2 tabs at bedtime xone week 3 tabs at bedtime xone week 02/08/23   Levert Feinstein, MD  ondansetron (ZOFRAN-ODT) 4 MG disintegrating tablet Take 1 tablet (4 mg total) by mouth every 8 (eight) hours as needed for nausea or vomiting. 02/08/23   Levert Feinstein, MD  propranolol (INDERAL) 10 MG tablet Take 1 tablet (10 mg total) by mouth every 8 (eight) hours as needed. 10/18/22   Del Nigel Berthold, FNP  rizatriptan (MAXALT-MLT) 10 MG disintegrating tablet Take 1 tablet (10 mg total) by mouth as needed. May repeat in 2 hours if needed 02/08/23   Levert Feinstein, MD  tiZANidine (ZANAFLEX) 4 MG tablet Take 1 tablet (4 mg total) by mouth every 6 (six) hours as needed for muscle spasms. 02/08/23   Levert Feinstein, MD      Allergies    Patient has no known allergies.    Review of Systems   Review of Systems  Constitutional:  Negative for fever.  Gastrointestinal:  Positive for diarrhea and vomiting.  Negative for blood in stool.    Physical Exam Updated Vital Signs BP 101/61   Pulse 94   Temp 99.6 F (37.6 C) (Oral)   Resp 18   Ht 1.702 m (5\' 7" )   Wt (!) 147.4 kg   SpO2 97%   BMI 50.90 kg/m  Physical Exam CONSTITUTIONAL: Well developed/well nourished HEAD: Normocephalic/atraumatic EYES: EOMI/PERRL, no icterus ENMT: Mucous membranes dry NECK: supple no meningeal signs SPINE/BACK:entire spine nontender CV: S1/S2 noted, no murmurs/rubs/gallops noted LUNGS: Lungs are clear to auscultation bilaterally, no apparent distress ABDOMEN: soft, nontender, no rebound or guarding, bowel sounds noted throughout abdomen obese NEURO: Pt is awake/alert/appropriate, moves all extremitiesx4.  No facial droop.   SKIN: warm, color normal PSYCH: no abnormalities of mood noted, alert and oriented to situation  ED Results / Procedures / Treatments   Labs (all labs ordered are listed, but only abnormal results are displayed) Labs Reviewed  COMPREHENSIVE METABOLIC PANEL - Abnormal; Notable for the following components:      Result Value   Glucose, Bld 100 (*)    Total Bilirubin 1.3 (*)    All other components within normal limits  CBC - Abnormal; Notable for the following components:   WBC 11.4 (*)    All other components within normal limits  RESP PANEL BY RT-PCR (RSV, FLU A&B,  COVID)  RVPGX2  LIPASE, BLOOD    EKG None  Radiology No results found.  Procedures Procedures    Medications Ordered in ED Medications - No data to display  ED Course/ Medical Decision Making/ A&P Clinical Course as of 02/13/23 2348  Tue Feb 13, 2023  2310 WBC(!): 11.4 Mild leukocytosis [DW]    Clinical Course User Index [DW] Zadie Rhine, MD                                 Medical Decision Making Amount and/or Complexity of Data Reviewed Labs: ordered. Decision-making details documented in ED Course.   This patient presents to the ED for concern of abdominal pain, vomiting and  diarrhea., this involves an extensive number of treatment options, and is a complaint that carries with it a high risk of complications and morbidity.  The differential diagnosis includes but is not limited to cholecystitis, cholelithiasis, pancreatitis, gastritis, peptic ulcer disease, appendicitis, bowel obstruction, bowel perforation, diverticulitis, AAA, ischemic bowel, gastroenteritis   Comorbidities that complicate the patient evaluation: Patient's presentation is complicated by their history of obesity   Additional history obtained: Additional history obtained from family  Lab Tests: I Ordered, and personally interpreted labs.  The pertinent results include: Labs overall unremarkable  Test Considered: I consider further workup including CT imaging, but since patient is improving without vomiting, he will be discharged  Complexity of problems addressed: Patient's presentation is most consistent with  acute presentation with potential threat to life or bodily function  Disposition: After consideration of the diagnostic results and the patient's response to treatment,  I feel that the patent would benefit from discharge   .           Final Clinical Impression(s) / ED Diagnoses Final diagnoses:  Nausea vomiting and diarrhea    Rx / DC Orders ED Discharge Orders     None         Zadie Rhine, MD 02/13/23 2351

## 2023-02-13 NOTE — Discharge Instructions (Signed)

## 2023-03-22 ENCOUNTER — Encounter (INDEPENDENT_AMBULATORY_CARE_PROVIDER_SITE_OTHER): Payer: Self-pay

## 2023-03-29 ENCOUNTER — Ambulatory Visit: Payer: Self-pay | Admitting: Family Medicine

## 2023-03-29 NOTE — Telephone Encounter (Signed)
 Close encounter

## 2023-04-03 ENCOUNTER — Telehealth: Payer: Self-pay | Admitting: Family Medicine

## 2023-04-03 NOTE — Telephone Encounter (Signed)
 Food & Nutrition Caseworker [employer]   Copied Noted Sleeved (gave to nurse)  Fax back when complete to [phone] attention: Melina

## 2023-04-06 ENCOUNTER — Other Ambulatory Visit: Payer: Self-pay | Admitting: Family Medicine

## 2023-04-09 ENCOUNTER — Telehealth: Payer: Self-pay | Admitting: Family Medicine

## 2023-04-09 NOTE — Telephone Encounter (Unsigned)
 Copied from CRM [id number]. Topic: General - Other >> Apr 09, 2023  1:51 PM Everette C wrote: Reason for CRM: The patient has called to request a letter for DSS stating that they're unable to work from their PCP   The patient shares that similar documents have been provided previously and that they need updated documentation. Please contact further if needed

## 2023-04-10 NOTE — Telephone Encounter (Signed)
I signed this form.

## 2023-04-11 NOTE — Telephone Encounter (Signed)
 Patient picked up form

## 2023-04-18 ENCOUNTER — Ambulatory Visit: Payer: MEDICAID | Admitting: Family Medicine

## 2023-04-27 ENCOUNTER — Ambulatory Visit (INDEPENDENT_AMBULATORY_CARE_PROVIDER_SITE_OTHER): Payer: MEDICAID | Admitting: Internal Medicine

## 2023-04-27 ENCOUNTER — Encounter: Payer: Self-pay | Admitting: Internal Medicine

## 2023-04-27 VITALS — BP 111/65 | HR 79 | Ht 67.0 in | Wt 323.6 lb

## 2023-04-27 DIAGNOSIS — L918 Other hypertrophic disorders of the skin: Secondary | ICD-10-CM | POA: Insufficient documentation

## 2023-04-27 NOTE — Progress Notes (Signed)
 Acute Office Visit  Subjective:    Patient ID: Henry [name], male    DOB: February 14, 2000, 23 y.o.   MRN: [id number]  Chief Complaint  Patient presents with   Skin Problem    Pt reports sx of skin tag under right arm     HPI Patient is in today for c/o a skin tag in the right upper arm area near axilla. He has irritation due to skin tag due to constant friction in that area. He has a bandage applied there today to avoid irritation. He prefers to have it removed.  Past Medical History:  Diagnosis Date   Acne    ADHD (attention deficit hyperactivity disorder)    Anxiety    Depression     History reviewed. No pertinent surgical history.  Family History  Problem Relation Age of Onset   Migraines Mother    Thyroid disease Mother    Thyroid cancer Mother    Migraines Paternal Grandmother    Diabetes Other    Heart disease Other     Social History   Socioeconomic History   Marital status: Single    Spouse name: Not on file   Number of children: Not on file   Years of education: Not on file   Highest education level: Not on file  Occupational History   Not on file  Tobacco Use   Smoking status: Never    Passive exposure: Never   Smokeless tobacco: Never  Vaping Use   Vaping status: Never Used  Substance and Sexual Activity   Alcohol use: No    Alcohol/week: 0.0 standard drinks of alcohol   Drug use: No   Sexual activity: Never  Other Topics Concern   Not on file  Social History Narrative   ** Merged History Encounter **       He lives with both parents and he has 1 sister. He enjoys playing video games and he loves history class   Social Drivers of Corporate investment banker Strain: Medium Risk (05/31/2022)   Received from [facility]   Overall Financial Resource Strain (CARDIA)    Difficulty of Paying Living Expenses: Somewhat hard  Food Insecurity: No Food Insecurity (05/31/2022)   Received from [facility]   Hunger Vital Sign    Worried About  Running Out of Food in the Last Year: Never true    Ran Out of Food in the Last Year: Never true  Transportation Needs: No Transportation Needs (05/31/2022)   Received from [facility] - Transportation    Lack of Transportation (Medical): No    Lack of Transportation (Non-Medical): No  Physical Activity: Not on file  Stress: Not on file  Social Connections: Not on file  Intimate Partner Violence: Not At Risk (05/31/2022)   Received from [facility]   Humiliation, Afraid, Rape, and Kick questionnaire    Fear of Current or Ex-Partner: No    Emotionally Abused: No    Physically Abused: No    Sexually Abused: No    Outpatient Medications Prior to Visit  Medication Sig Dispense Refill   lamoTRIgine (LAMICTAL) 100 MG tablet Take 1 tablet (100 mg total) by mouth at bedtime. 30 tablet 11   lamoTRIgine (LAMICTAL) 25 MG tablet One at bedtime xone week 2 tabs at bedtime xone week 3 tabs at bedtime xone week 42 tablet 0   ondansetron (ZOFRAN-ODT) 4 MG disintegrating tablet Take 1 tablet (4 mg total) by mouth  every 8 (eight) hours as needed for nausea or vomiting. 20 tablet 6   propranolol (INDERAL) 10 MG tablet Take 1 tablet (10 mg total) by mouth every 8 (eight) hours as needed. 30 tablet 2   rizatriptan (MAXALT-MLT) 10 MG disintegrating tablet Take 1 tablet (10 mg total) by mouth as needed. May repeat in 2 hours if needed 12 tablet 6   tiZANidine (ZANAFLEX) 4 MG tablet Take 1 tablet (4 mg total) by mouth every 6 (six) hours as needed for muscle spasms. 30 tablet 6   No facility-administered medications prior to visit.    No Known Allergies  Review of Systems  Constitutional:  Negative for chills and fever.  HENT:  Negative for congestion and sore throat.   Respiratory:  Negative for cough and shortness of breath.   Cardiovascular:  Negative for chest pain and palpitations.  Endocrine: Negative for polydipsia and polyuria.  Genitourinary:  Negative for dysuria and  hematuria.  Musculoskeletal:  Negative for neck pain and neck stiffness.  Skin:  Negative for rash.       Skin tag under right arm  Neurological:  Negative for dizziness, weakness, numbness and headaches.  Psychiatric/Behavioral:  Negative for agitation and behavioral problems.        Objective:    Physical Exam Vitals reviewed.  Constitutional:      General: He is not in acute distress.    Appearance: He is obese. He is not diaphoretic.  HENT:     Head: Normocephalic and atraumatic.     Nose: Nose normal.     Mouth/Throat:     Mouth: Mucous membranes are moist.  Eyes:     General: No scleral icterus.    Extraocular Movements: Extraocular movements intact.  Cardiovascular:     Rate and Rhythm: Normal rate and regular rhythm.     Heart sounds: Normal heart sounds. No murmur heard. Pulmonary:     Breath sounds: Normal breath sounds. No wheezing or rales.  Musculoskeletal:     Right lower leg: No edema.     Left lower leg: No edema.  Skin:    General: Skin is warm.     Findings: No rash.  Neurological:     General: No focal deficit present.     Mental Status: He is alert and oriented to person, place, and time.  Psychiatric:        Mood and Affect: Mood normal.        Behavior: Behavior normal.     BP 111/65   Pulse 79   Ht 5\' 7"  (1.702 m)   Wt (!) 323 lb 9.6 oz (146.8 kg)   SpO2 95%   BMI 50.68 kg/m  Wt Readings from Last 3 Encounters:  04/27/23 (!) 323 lb 9.6 oz (146.8 kg)  02/13/23 (!) 325 lb (147.4 kg)  02/08/23 (!) 325 lb (147.4 kg)        Assessment & Plan:   Problem List Items Addressed This Visit       Musculoskeletal and Integument   Skin tag - Primary   Has irritation in the right underarm area due to skin tag Explained to patient that skin tags have tendency to recur Explained use of cryotherapy and freezing the skin tag, he expressed understanding, verbal consent obtained. Skin tag removed with cryotherapy liquid, gentle traction provided  with forceps. Bandage placed for oozing. Patient tolerated procedure well.        No orders of the defined types were placed in this  encounter.    Anabel Halon, MD

## 2023-04-27 NOTE — Patient Instructions (Signed)
 Okay to apply water and shower tomorrow.

## 2023-04-27 NOTE — Assessment & Plan Note (Signed)
 Has irritation in the right underarm area due to skin tag Explained to patient that skin tags have tendency to recur Explained use of cryotherapy and freezing the skin tag, he expressed understanding, verbal consent obtained. Skin tag removed with cryotherapy liquid, gentle traction provided with forceps. Bandage placed for oozing. Patient tolerated procedure well.

## 2023-06-08 ENCOUNTER — Encounter: Payer: Self-pay | Admitting: Family Medicine

## 2023-06-08 ENCOUNTER — Ambulatory Visit (INDEPENDENT_AMBULATORY_CARE_PROVIDER_SITE_OTHER): Payer: MEDICAID | Admitting: Family Medicine

## 2023-06-08 VITALS — BP 113/77 | HR 78 | Ht 67.0 in | Wt 331.0 lb

## 2023-06-08 DIAGNOSIS — D509 Iron deficiency anemia, unspecified: Secondary | ICD-10-CM

## 2023-06-08 DIAGNOSIS — R7301 Impaired fasting glucose: Secondary | ICD-10-CM

## 2023-06-08 DIAGNOSIS — E782 Mixed hyperlipidemia: Secondary | ICD-10-CM

## 2023-06-08 DIAGNOSIS — E038 Other specified hypothyroidism: Secondary | ICD-10-CM

## 2023-06-08 DIAGNOSIS — R5383 Other fatigue: Secondary | ICD-10-CM

## 2023-06-08 DIAGNOSIS — E559 Vitamin D deficiency, unspecified: Secondary | ICD-10-CM

## 2023-06-08 NOTE — Assessment & Plan Note (Addendum)
 Labs ordered to rule out any deficiency  Discussed focus on good self-care habits. Get regular, quality sleep by sticking to a consistent schedule. Light exercise like walking or stretching can improve energy levels. Eat a balanced diet, stay hydrated, and avoid too much caffeine or sugar. Take breaks during tasks to pace yourself and prevent burnout.

## 2023-06-08 NOTE — Progress Notes (Signed)
 Established Patient Office Visit   Subjective  Patient ID: Henry [name], male    DOB: July 01, 2000  Age: 23 y.o. MRN: [id number]  Chief Complaint  Patient presents with   Medical Management of Chronic Issues    Fainted w/ nausea  feel like his strength is low. 05/12 Since he has felt his hands are shaking, he is dizzy and nauseas constantly. Chronic fatigue, feels tired even after sleep      He  has a past medical history of Acne, ADHD (attention deficit hyperactivity disorder), Anxiety, and Depression.  Patient reports experiencing fatigue that started gradually over the past few weeks. The fatigue is constant, persisting from the time he goes to sleep through waking, with no relief upon waking in the morning. He notes that the fatigue tends to worsen in the afternoon. On a scale of 1 to 10, he rates the severity of his fatigue as a 6, and it does interfere with his daily activities. Despite this, he remains active, recently obtaining a gym membership and walking his dog 3 to 4 miles daily. The patient also experiences occasional migraines and reports a longstanding history of anxiety, though he denies any recent changes in mood. His sleep schedule is irregular, typically sleeping from 3 a.m. to 10 a.m., and he does not feel rested upon waking. He feels more tired than usual after light activities. Regarding diet, he admits to an inconsistent eating schedule and tends to overeat when he does eat. He denies use of caffeine or alcohol and is not currently taking any medications that might affect his energy levels.    Review of Systems  Constitutional:  Negative for chills and fever.  Respiratory:  Negative for shortness of breath.   Cardiovascular:  Negative for chest pain.  Neurological:  Positive for dizziness, weakness and headaches.      Objective:     BP 113/77   Pulse 78   Ht 5\' 7"  (1.702 m)   Wt (!) 331 lb 0.6 oz (150.2 kg)   SpO2 96%   BMI 51.85 kg/m  BP Readings from  Last 3 Encounters:  06/08/23 113/77  04/27/23 111/65  02/13/23 101/61      Physical Exam Vitals reviewed.  Constitutional:      General: He is not in acute distress.    Appearance: Normal appearance. He is not ill-appearing, toxic-appearing or diaphoretic.  HENT:     Head: Normocephalic.  Eyes:     General:        Right eye: No discharge.        Left eye: No discharge.     Conjunctiva/sclera: Conjunctivae normal.  Cardiovascular:     Rate and Rhythm: Normal rate.     Pulses: Normal pulses.     Heart sounds: Normal heart sounds.  Pulmonary:     Effort: Pulmonary effort is normal. No respiratory distress.     Breath sounds: Normal breath sounds.  Abdominal:     Tenderness: There is no right CVA tenderness.  Skin:    General: Skin is warm and dry.     Capillary Refill: Capillary refill takes less than 2 seconds.  Neurological:     General: No focal deficit present.     Mental Status: He is alert and oriented to person, place, and time.     Coordination: Coordination normal.     Gait: Gait normal.  Psychiatric:        Mood and Affect: Mood normal.  Behavior: Behavior normal.      No results found for any visits on 06/08/23.  The ASCVD Risk score (Arnett DK, et al., 2019) failed to calculate for the following reasons:   The 2019 ASCVD risk score is only valid for ages 53 to 7    Assessment & Plan:  Mixed hyperlipidemia -     BMP8+eGFR -     CBC with Differential/Platelet -     Lipid panel  TSH (thyroid -stimulating hormone deficiency) -     TSH + free T4  IFG (impaired fasting glucose) -     Hemoglobin A1c  Iron deficiency anemia, unspecified iron deficiency anemia type -     Iron, TIBC and Ferritin Panel -     B12 and Folate Panel  Vitamin D  deficiency -     VITAMIN D  25 Hydroxy (Vit-D Deficiency, Fractures)  Other fatigue Assessment & Plan: Labs ordered to rule out  any deficiency Discussed focus on good self-care habits. Get regular, quality  sleep by sticking to a consistent schedule. Light exercise like walking or stretching can improve energy levels. Eat a balanced diet, stay hydrated, and avoid too much caffeine or sugar. Take breaks during tasks to pace yourself and prevent burnout.   Orders: -     Urinalysis    Return in 6 months (on 12/09/2023), or if symptoms worsen or fail to improve, for Follow up.   Henry Lek Amber Bail, FNP

## 2023-06-08 NOTE — Patient Instructions (Signed)

## 2023-06-12 ENCOUNTER — Other Ambulatory Visit: Payer: Self-pay | Admitting: Family Medicine

## 2023-06-12 ENCOUNTER — Ambulatory Visit: Payer: Self-pay | Admitting: Family Medicine

## 2023-06-12 DIAGNOSIS — E038 Other specified hypothyroidism: Secondary | ICD-10-CM

## 2023-06-12 LAB — TSH+FREE T4
Free T4: 1.17 ng/dL (ref 0.82–1.77)
TSH: 4.6 u[IU]/mL — ABNORMAL HIGH (ref 0.450–4.500)

## 2023-06-12 LAB — BMP8+EGFR
BUN/Creatinine Ratio: 14 (ref 9–20)
BUN: 11 mg/dL (ref 6–20)
CO2: 23 mmol/L (ref 20–29)
Calcium: 9.4 mg/dL (ref 8.7–10.2)
Chloride: 102 mmol/L (ref 96–106)
Creatinine, Ser: 0.78 mg/dL (ref 0.76–1.27)
Glucose: 85 mg/dL (ref 70–99)
Potassium: 4.3 mmol/L (ref 3.5–5.2)
Sodium: 140 mmol/L (ref 134–144)
eGFR: 128 mL/min/{1.73_m2} (ref >59)

## 2023-06-12 LAB — CBC WITH DIFFERENTIAL/PLATELET
Basophils Absolute: 0 10*3/uL (ref 0.0–0.2)
Basos: 0 %
EOS (ABSOLUTE): 0.1 10*3/uL (ref 0.0–0.4)
Eos: 2 %
Hematocrit: 44.7 % (ref 37.5–51.0)
Hemoglobin: 14.7 g/dL (ref 13.0–17.7)
Immature Grans (Abs): 0 10*3/uL (ref 0.0–0.1)
Immature Granulocytes: 0 %
Lymphocytes Absolute: 1.4 10*3/uL (ref 0.7–3.1)
Lymphs: 23 %
MCH: 30.1 pg (ref 26.6–33.0)
MCHC: 32.9 g/dL (ref 31.5–35.7)
MCV: 91 fL (ref 79–97)
Monocytes Absolute: 0.4 10*3/uL (ref 0.1–0.9)
Monocytes: 7 %
Neutrophils Absolute: 4.2 10*3/uL (ref 1.4–7.0)
Neutrophils: 68 %
Platelets: 249 10*3/uL (ref 150–450)
RBC: 4.89 x10E6/uL (ref 4.14–5.80)
RDW: 13.7 % (ref 11.6–15.4)
WBC: 6.2 10*3/uL (ref 3.4–10.8)

## 2023-06-12 LAB — URINALYSIS
Bilirubin, UA: NEGATIVE
Glucose, UA: NEGATIVE
Ketones, UA: NEGATIVE
Leukocytes,UA: NEGATIVE
Nitrite, UA: NEGATIVE
RBC, UA: NEGATIVE
Specific Gravity, UA: 1.028 (ref 1.005–1.030)
Urobilinogen, Ur: 0.2 mg/dL (ref 0.2–1.0)
pH, UA: 5.5 (ref 5.0–7.5)

## 2023-06-12 LAB — LIPID PANEL
Chol/HDL Ratio: 5.2 ratio — ABNORMAL HIGH (ref 0.0–5.0)
Cholesterol, Total: 183 mg/dL (ref 100–199)
HDL: 35 mg/dL — ABNORMAL LOW (ref >39)
LDL Chol Calc (NIH): 112 mg/dL — ABNORMAL HIGH (ref 0–99)
Triglycerides: 206 mg/dL — ABNORMAL HIGH (ref 0–149)
VLDL Cholesterol Cal: 36 mg/dL (ref 5–40)

## 2023-06-12 LAB — IRON,TIBC AND FERRITIN PANEL
Ferritin: 202 ng/mL (ref 30–400)
Iron Saturation: 27 % (ref 15–55)
Iron: 75 ug/dL (ref 38–169)
Total Iron Binding Capacity: 278 ug/dL (ref 250–450)
UIBC: 203 ug/dL (ref 111–343)

## 2023-06-12 LAB — HEMOGLOBIN A1C
Est. average glucose Bld gHb Est-mCnc: 94 mg/dL
Hgb A1c MFr Bld: 4.9 % (ref 4.8–5.6)

## 2023-06-12 LAB — VITAMIN D 25 HYDROXY (VIT D DEFICIENCY, FRACTURES): Vit D, 25-Hydroxy: 18.1 ng/mL — ABNORMAL LOW (ref 30.0–100.0)

## 2023-06-12 LAB — B12 AND FOLATE PANEL
Folate: 4.5 ng/mL (ref >3.0)
Vitamin B-12: 520 pg/mL (ref 232–1245)

## 2023-06-21 ENCOUNTER — Ambulatory Visit (INDEPENDENT_AMBULATORY_CARE_PROVIDER_SITE_OTHER): Payer: MEDICAID | Admitting: Family Medicine

## 2023-06-21 ENCOUNTER — Encounter: Payer: Self-pay | Admitting: Family Medicine

## 2023-06-21 VITALS — BP 115/77 | HR 87 | Ht 67.0 in | Wt 323.8 lb

## 2023-06-21 DIAGNOSIS — E785 Hyperlipidemia, unspecified: Secondary | ICD-10-CM

## 2023-06-21 DIAGNOSIS — E038 Other specified hypothyroidism: Secondary | ICD-10-CM | POA: Diagnosis not present

## 2023-06-21 DIAGNOSIS — R7989 Other specified abnormal findings of blood chemistry: Secondary | ICD-10-CM | POA: Diagnosis not present

## 2023-06-21 NOTE — Progress Notes (Signed)
   Established Patient Office Visit   Subjective  Patient ID: Henry [name], male    DOB: 04-27-2000  Age: 23 y.o. MRN: [id number]  Chief Complaint  Patient presents with   Hyperlipidemia    He  has a past medical history of Acne, ADHD (attention deficit hyperactivity disorder), Anxiety, and Depression.  HPI Patient presents to the clinic for follow up. For the details of today's visit, please refer to assessment and plan.  Review of Systems  Constitutional:  Negative for chills and fever.  Respiratory:  Negative for shortness of breath.   Cardiovascular:  Negative for chest pain.  Neurological:  Negative for dizziness.      Objective:     BP 115/77   Pulse 87   Ht 5\' 7"  (1.702 m)   Wt (!) 323 lb 12 oz (146.9 kg)   SpO2 99%   BMI 50.71 kg/m  BP Readings from Last 3 Encounters:  06/21/23 115/77  06/08/23 113/77  04/27/23 111/65      Physical Exam Vitals reviewed.  Constitutional:      General: He is not in acute distress.    Appearance: Normal appearance. He is not ill-appearing, toxic-appearing or diaphoretic.  HENT:     Head: Normocephalic.  Eyes:     General:        Right eye: No discharge.        Left eye: No discharge.     Conjunctiva/sclera: Conjunctivae normal.  Cardiovascular:     Rate and Rhythm: Normal rate.     Pulses: Normal pulses.     Heart sounds: Normal heart sounds.  Pulmonary:     Effort: Pulmonary effort is normal. No respiratory distress.     Breath sounds: Normal breath sounds.  Skin:    General: Skin is warm and dry.     Capillary Refill: Capillary refill takes less than 2 seconds.  Neurological:     Mental Status: He is alert.  Psychiatric:        Mood and Affect: Mood normal.      No results found for any visits on 06/21/23.  The ASCVD Risk score (Arnett DK, et al., 2019) failed to calculate for the following reasons:   The 2019 ASCVD risk score is only valid for ages 58 to 54    Assessment & Plan:  TSH  (thyroid -stimulating hormone deficiency) -     TSH + free T4 -     Thyroid  stimulating immunoglobulin -     Thyroid  peroxidase antibody  Elevated TSH Assessment & Plan: Repeat panel done today     Dyslipidemia Assessment & Plan: Recent LDL 112 Discussed Eat a Balanced Diet: Focus on whole, nutrient-dense foods like lean proteins, vegetables, fruits, whole grains, and healthy fats while avoiding processed and sugary foods. Stay Active: Incorporate at least 30 minutes of moderate physical activity most days of the week, such as walking, jogging, or strength training. Hydrate and Rest: Drink plenty of water throughout the day and ensure you get 7-9 hours of quality sleep each night to support metabolism and recovery. Practice Portion Control: Use smaller plates, measure portions, and eat mindfully to avoid overeating and manage calorie intake effectively.      Return in about 6 months (around 12/22/2023), or if symptoms worsen or fail to improve, for thyroid , hyperlipidemia.   Avelino Lek Amber Bail, FNP

## 2023-06-21 NOTE — Assessment & Plan Note (Signed)
 Repeat panel done today

## 2023-06-21 NOTE — Patient Instructions (Signed)

## 2023-06-21 NOTE — Assessment & Plan Note (Signed)
 Recent LDL 112 Discussed Eat a Balanced Diet: Focus on whole, nutrient-dense foods like lean proteins, vegetables, fruits, whole grains, and healthy fats while avoiding processed and sugary foods. Stay Active: Incorporate at least 30 minutes of moderate physical activity most days of the week, such as walking, jogging, or strength training. Hydrate and Rest: Drink plenty of water throughout the day and ensure you get 7-9 hours of quality sleep each night to support metabolism and recovery. Practice Portion Control: Use smaller plates, measure portions, and eat mindfully to avoid overeating and manage calorie intake effectively.

## 2023-08-14 ENCOUNTER — Encounter: Payer: Self-pay | Admitting: Neurology

## 2023-08-14 ENCOUNTER — Ambulatory Visit (INDEPENDENT_AMBULATORY_CARE_PROVIDER_SITE_OTHER): Payer: MEDICAID | Admitting: Neurology

## 2023-08-14 VITALS — BP 130/74 | Ht 67.0 in | Wt 335.0 lb

## 2023-08-14 DIAGNOSIS — G43709 Chronic migraine without aura, not intractable, without status migrainosus: Secondary | ICD-10-CM | POA: Diagnosis not present

## 2023-08-14 DIAGNOSIS — G43009 Migraine without aura, not intractable, without status migrainosus: Secondary | ICD-10-CM

## 2023-08-14 MED ORDER — RIZATRIPTAN BENZOATE 10 MG PO TBDP: 10.0000 mg | ORAL_TABLET | ORAL | 6 refills | Status: AC | PRN

## 2023-08-14 MED ORDER — PROPRANOLOL HCL 40 MG PO TABS: 40.0000 mg | ORAL_TABLET | Freq: Three times a day (TID) | ORAL | 5 refills | Status: AC | PRN

## 2023-08-14 NOTE — Progress Notes (Signed)
 Chief Complaint  Patient presents with   Migraine    Rm Migraine:   Follow-up    Rm 15, alone, f/u, stopped taking meds due to reaction. He isn't sure from what medication. Went to the ER      ASSESSMENT AND PLAN  Henry [name] is a 23 y.o. 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   MEDICAL HISTORY:  Henry [name] is a 23 year old male, seen in request by his primary care nurse practitioner Del Wilhelmena Lloyd Sola for evaluation of migraine, initial evaluation was February 08, 2023   History is obtained from the patient and review of electronic medical records. I personally reviewed pertinent available imaging films in PACS.   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  UPDATE August 14 2023: He is not on Lamotrigine  any more, not sure about the medication that he is currently taking, continue  dealing with anxiety,  He presented to local hospital for 1 episode of severe prolonged headaches, nausea and vomiting throughout,  His headache overall has improved, once every couple weeks  PHYSICAL EXAM:   Vitals:   08/14/23 1454  BP: 130/74  Weight: (!) 335 lb (152 kg)  Height: 5' 7 (1.702 m)   Body mass index is 52.47 kg/m.  PHYSICAL EXAMNIATION:  Gen: NAD, conversant, well nourised, well groomed                     Cardiovascular: Regular rate rhythm, no peripheral edema, warm, nontender. Eyes: Conjunctivae clear without exudates or hemorrhage Neck: Supple, no carotid bruits. Pulmonary: Clear to auscultation bilaterally   NEUROLOGICAL EXAM:  MENTAL STATUS: Speech/cognition: Obese, awake, alert, oriented to history taking and casual conversation CRANIAL NERVES: CN II: Visual fields are full to confrontation. Pupils are round equal and briskly reactive to light. CN III, IV, VI: extraocular movement are normal. No ptosis. CN V: Facial sensation is intact to light touch CN VII: Face is symmetric with normal eye closure  CN VIII: Hearing is normal to causal conversation. CN IX, X: Phonation is normal. CN XI: Head turning and shoulder shrug are intact  MOTOR: There is no pronator drift of out-stretched arms. Muscle bulk and tone are normal. Muscle strength is normal.  REFLEXES: Reflexes are 2+ and symmetric at the biceps, triceps, knees, and ankles. Plantar responses are flexor.  SENSORY: Intact to light touch, pinprick and vibratory sensation  are intact in fingers and toes.  COORDINATION: There is no trunk or limb dysmetria noted.  GAIT/STANCE: Posture is normal. Gait is steady   REVIEW OF SYSTEMS:  Full 14 system review of systems performed and notable only for as above All other review of systems were negative.   ALLERGIES: No Known Allergies  HOME MEDICATIONS: Current Outpatient Medications  Medication Sig Dispense Refill   ondansetron   (ZOFRAN -ODT) 4 MG disintegrating tablet Take 1 tablet (4 mg total) by mouth every 8 (eight) hours as needed for nausea or vomiting. 20 tablet 6   tiZANidine  (ZANAFLEX ) 4 MG tablet Take 1 tablet (4 mg total) by mouth every 6 (six) hours as needed for muscle spasms. 30 tablet 6   lamoTRIgine  (LAMICTAL ) 100 MG tablet Take 1 tablet (100 mg total) by mouth at bedtime. (Patient not taking: Reported on 08/14/2023) 30 tablet 11   lamoTRIgine  (LAMICTAL ) 25 MG tablet One at bedtime xone week 2 tabs at bedtime xone week 3 tabs at bedtime xone week (Patient not taking: Reported on 08/14/2023) 42 tablet 0   propranolol  (INDERAL ) 10 MG tablet Take 1 tablet (10 mg total) by mouth every 8 (eight) hours as needed. (Patient not taking: Reported on 08/14/2023) 30 tablet 2   rizatriptan  (MAXALT -MLT) 10 MG disintegrating tablet Take 1 tablet (10 mg total) by mouth as needed. May repeat in 2 hours if needed (Patient not taking: Reported on 08/14/2023) 12 tablet 6   No current facility-administered medications for this visit.    PAST MEDICAL HISTORY: Past Medical History:  Diagnosis Date   Acne    ADHD (attention deficit hyperactivity disorder)    Anxiety    Depression     PAST SURGICAL HISTORY: History reviewed. No pertinent surgical history.  FAMILY HISTORY: Family History  Problem Relation Age of Onset   Migraines Mother    Thyroid  disease Mother    Thyroid  cancer Mother    Migraines Paternal Grandmother    Diabetes Other    Heart disease Other     SOCIAL HISTORY: Social History   Socioeconomic History   Marital status: Single    Spouse name: Not on file   Number of children: Not on file   Years of education: Not on file   Highest education level: Not on file  Occupational History   Not on file  Tobacco Use   Smoking status: Never    Passive exposure: Never   Smokeless tobacco: Never  Vaping Use   Vaping status: Never Used  Substance and Sexual Activity   Alcohol use: No     Alcohol/week: 0.0 standard drinks of alcohol   Drug use: No   Sexual activity: Never  Other Topics Concern   Not on file  Social History Narrative    He lives with both parents and he has 1 sister. He enjoys playing video games and he loves history class   Right handed   Caffeine-1 daily   not currently working, on disability   Social Drivers of Health   Financial Resource Strain: Medium Risk (05/31/2022)   Received from [facility]   Overall Financial Resource Strain (CARDIA)    Difficulty of Paying Living Expenses: Somewhat hard  Food Insecurity: No Food Insecurity (05/31/2022)   Received from [facility]   Hunger Vital Sign    Within the past 12 months, you worried that your food would run out before you got the money to buy more.: Never true    Within the past 12  months, the food you bought just didn't last and you didn't have money to get more.: Never true  Transportation Needs: No Transportation Needs (05/31/2022)   Received from [facility] - Transportation    Lack of Transportation (Medical): No    Lack of Transportation (Non-Medical): No  Physical Activity: Not on file  Stress: Not on file  Social Connections: Not on file  Intimate Partner Violence: Not At Risk (05/31/2022)   Received from [facility]   Humiliation, Afraid, Rape, and Kick questionnaire    Within the last year, have you been afraid of your partner or ex-partner?: No    Within the last year, have you been humiliated or emotionally abused in other ways by your partner or ex-partner?: No    Within the last year, have you been kicked, hit, slapped, or otherwise physically hurt by your partner or ex-partner?: No    Within the last year, have you been raped or forced to have any kind of sexual activity by your partner or ex-partner?: No      Modena Callander, M.D. Ph.D.  [facility] [address] [place], [place] [address] Ph: [phone] Fax:  [phone]  CC:  Terry Wilhelmena Lloyd Hilario, FNP [phone] [address] [place],  [place] [address]  Del Wilhelmena Lloyd Hilario, FNP

## 2023-09-14 ENCOUNTER — Encounter: Payer: Self-pay | Admitting: Radiology

## 2023-09-22 ENCOUNTER — Emergency Department (HOSPITAL_COMMUNITY)
Admission: EM | Admit: 2023-09-22 | Discharge: 2023-09-22 | Disposition: A | Discharge status: Home / Self Care | Payer: MEDICAID | Attending: Emergency Medicine | Admitting: Emergency Medicine

## 2023-09-22 ENCOUNTER — Encounter (HOSPITAL_COMMUNITY): Payer: Self-pay

## 2023-09-22 ENCOUNTER — Other Ambulatory Visit: Payer: Self-pay

## 2023-09-22 DIAGNOSIS — K529 Noninfective gastroenteritis and colitis, unspecified: Secondary | ICD-10-CM | POA: Insufficient documentation

## 2023-09-22 DIAGNOSIS — R000 Tachycardia, unspecified: Secondary | ICD-10-CM | POA: Insufficient documentation

## 2023-09-22 DIAGNOSIS — R112 Nausea with vomiting, unspecified: Secondary | ICD-10-CM | POA: Diagnosis present

## 2023-09-22 LAB — RESP PANEL BY RT-PCR (RSV, FLU A&B, COVID)  RVPGX2
Influenza A by PCR: NEGATIVE
Influenza B by PCR: NEGATIVE
Resp Syncytial Virus by PCR: NEGATIVE
SARS Coronavirus 2 by RT PCR: NEGATIVE

## 2023-09-22 MED ORDER — ACETAMINOPHEN 500 MG PO TABS
1000.0000 mg | ORAL_TABLET | Freq: Once | ORAL | Status: AC
  Administered 2023-09-22: 1000 mg via ORAL
  Filled 2023-09-22: qty 2

## 2023-09-22 MED ORDER — ONDANSETRON 4 MG PO TBDP: 4.0000 mg | ORAL_TABLET | Freq: Three times a day (TID) | ORAL | 0 refills | Status: AC | PRN

## 2023-09-22 MED ORDER — ONDANSETRON HCL 4 MG/2ML IJ SOLN
4.0000 mg | Freq: Once | INTRAMUSCULAR | Status: AC
  Administered 2023-09-22: 4 mg via INTRAVENOUS
  Filled 2023-09-22: qty 2

## 2023-09-22 MED ORDER — SODIUM CHLORIDE 0.9 % IV BOLUS
1000.0000 mL | Freq: Once | INTRAVENOUS | Status: AC
  Administered 2023-09-22: 1000 mL via INTRAVENOUS

## 2023-09-22 NOTE — ED Notes (Signed)
 Patient tolerating po. Has not thrown up anymore

## 2023-09-22 NOTE — ED Notes (Signed)
 ED Provider at bedside.

## 2023-09-22 NOTE — ED Notes (Signed)
 Rn to room to start iv and give medications. Patient reports needed to use restroom. Wheeled to restroom.

## 2023-09-22 NOTE — ED Notes (Signed)
 Patient reports feeling almost all the way better IVF finished. Temperature reassessed. Denies any needs. Lights off for comfort. Family remains at bedside.

## 2023-09-22 NOTE — ED Provider Notes (Signed)
  EMERGENCY DEPARTMENT AT [facility] Provider Note   CSN: [id number] Arrival date & time: 09/22/23  9785     Patient presents with: Emesis   Henry [name] is a 23 y.o. male.   HPI     This is a 23 year old male who presents with chills and nausea vomiting.  Onset of symptoms tonight after midnight.  No known sick contacts.  Reports he felt very chilled and has had 1 episode of nausea and vomiting.  No diarrhea.  Denies chest pain, shortness of breath, cough.  Prior to Admission medications   Medication Sig Start Date End Date Taking? Authorizing Provider  ondansetron  (ZOFRAN -ODT) 4 MG disintegrating tablet Take 1 tablet (4 mg total) by mouth every 8 (eight) hours as needed for nausea or vomiting. 09/22/23  Yes Yetta Marceaux, Charmaine FALCON, MD  lamoTRIgine  (LAMICTAL ) 100 MG tablet Take 1 tablet (100 mg total) by mouth at bedtime. Patient not taking: Reported on 08/14/2023 02/08/23   Onita Duos, MD  lamoTRIgine  (LAMICTAL ) 25 MG tablet One at bedtime xone week 2 tabs at bedtime xone week 3 tabs at bedtime xone week Patient not taking: Reported on 08/14/2023 02/08/23   Onita Duos, MD  ondansetron  (ZOFRAN -ODT) 4 MG disintegrating tablet Take 1 tablet (4 mg total) by mouth every 8 (eight) hours as needed for nausea or vomiting. 02/08/23   Onita Duos, MD  propranolol  (INDERAL ) 40 MG tablet Take 1 tablet (40 mg total) by mouth every 8 (eight) hours as needed. 08/14/23   Onita Duos, MD  rizatriptan  (MAXALT -MLT) 10 MG disintegrating tablet Take 1 tablet (10 mg total) by mouth as needed. May repeat in 2 hours if needed 08/14/23   Onita Duos, MD  tiZANidine  (ZANAFLEX ) 4 MG tablet Take 1 tablet (4 mg total) by mouth every 6 (six) hours as needed for muscle spasms. 02/08/23   Onita Duos, MD    Allergies: Patient has no known allergies.    Review of Systems  Constitutional:  Positive for fever.  Respiratory:  Negative for shortness of breath.   Cardiovascular:  Negative for chest pain.   Gastrointestinal:  Positive for nausea and vomiting. Negative for abdominal pain and diarrhea.  All other systems reviewed and are negative.   Updated Vital Signs BP (!) 108/54   Pulse 96   Temp 100 F (37.8 C) (Oral)   Resp 16   Ht 1.702 m (5' 7)   Wt (!) 149.7 kg   SpO2 100%   BMI 51.69 kg/m   Physical Exam Vitals and nursing note reviewed.  Constitutional:      Appearance: He is well-developed. He is obese. He is not ill-appearing.  HENT:     Head: Normocephalic and atraumatic.     Mouth/Throat:     Mouth: Mucous membranes are moist.  Eyes:     Pupils: Pupils are equal, round, and reactive to light.  Cardiovascular:     Rate and Rhythm: Regular rhythm. Tachycardia present.     Heart sounds: Normal heart sounds. No murmur heard. Pulmonary:     Effort: Pulmonary effort is normal. No respiratory distress.     Breath sounds: Normal breath sounds. No wheezing.  Abdominal:     General: Bowel sounds are normal.     Palpations: Abdomen is soft.     Tenderness: There is no abdominal tenderness. There is no rebound.  Musculoskeletal:     Cervical back: Neck supple.  Lymphadenopathy:     Cervical: No cervical adenopathy.  Skin:  General: Skin is warm and dry.  Neurological:     Mental Status: He is alert and oriented to person, place, and time.  Psychiatric:        Mood and Affect: Mood normal.     (all labs ordered are listed, but only abnormal results are displayed) Labs Reviewed  RESP PANEL BY RT-PCR (RSV, FLU A&B, COVID)  [id number]    EKG: None  Radiology: No results found.   Procedures   Medications Ordered in the ED  sodium chloride  0.9 % bolus 1,000 mL (0 mLs Intravenous Stopped 09/22/23 0432)  ondansetron  (ZOFRAN ) injection 4 mg (4 mg Intravenous Given 09/22/23 0339)  acetaminophen  (TYLENOL ) tablet 1,000 mg (1,000 mg Oral Given 09/22/23 9660)                                    Medical Decision Making Risk OTC drugs. Prescription drug  management.   This patient presents to the ED for concern of chills, vomiting, this involves an extensive number of treatment options, and is a complaint that carries with it a high risk of complications and morbidity.  I considered the following differential and admission for this acute, potentially life threatening condition.  The differential diagnosis includes viral illness, gastroenteritis, COVID, influenza  MDM:    This is a 23 year old male who presents with chills and nausea vomiting.  Febrile upon arrival to 102.1.  Has related tachycardia.  Patient given fluids, Tylenol , Zofran .  Abdominal exam is benign.  Favor viral etiology.  COVID and flu testing are negative.  On recheck, he states he feels much better after fluids and Zofran .  Not vomiting anymore and temperature is now down to 100 with normalized vital signs.  Recommend supportive measures at home.  Will discharge with Zofran .  (Labs, imaging, consults)  Labs: I Ordered, and personally interpreted labs.  The pertinent results include: COVID, flu  Imaging Studies ordered: I ordered imaging studies including none I independently visualized and interpreted imaging. I agree with the radiologist interpretation  Additional history obtained from chart review.  External records from outside source obtained and reviewed including prior evaluations  Cardiac Monitoring: The patient was maintained on a cardiac monitor.  If on the cardiac monitor, I personally viewed and interpreted the cardiac monitored which showed an underlying rhythm of: Sinus  Reevaluation: After the interventions noted above, I reevaluated the patient and found that they have :improved  Social Determinants of Health:  lives independently  Disposition: Discharge  Co morbidities that complicate the patient evaluation  Past Medical History:  Diagnosis Date   Acne    ADHD (attention deficit hyperactivity disorder)    Anxiety    Depression       Medicines Meds ordered this encounter  Medications   sodium chloride  0.9 % bolus 1,000 mL   ondansetron  (ZOFRAN ) injection 4 mg   acetaminophen  (TYLENOL ) tablet 1,000 mg   ondansetron  (ZOFRAN -ODT) 4 MG disintegrating tablet    Sig: Take 1 tablet (4 mg total) by mouth every 8 (eight) hours as needed for nausea or vomiting.    Dispense:  20 tablet    Refill:  0    I have reviewed the patients home medicines and have made adjustments as needed  Problem List / ED Course: Problem List Items Addressed This Visit   None Visit Diagnoses       Gastroenteritis    -  Primary  Final diagnoses:  Gastroenteritis    ED Discharge Orders          Ordered    ondansetron  (ZOFRAN -ODT) 4 MG disintegrating tablet  Every 8 hours PRN        09/22/23 0452               Bari Charmaine FALCON, MD 09/22/23 [phone]

## 2023-09-22 NOTE — ED Triage Notes (Signed)
 POV from home cc of chills and lower back pain.  Emesis x1 when he checked in Started an hour ago.

## 2023-09-22 NOTE — Discharge Instructions (Signed)
 You were seen today for nausea, vomiting, fever.  This is likely viral in nature.  Take Zofran  as needed for nausea and vomiting.  Take Tylenol  and ibuprofen  for any body aches, pains, fevers.  Make sure that you are drinking plenty of fluids and staying hydrated.

## 2023-11-26 ENCOUNTER — Emergency Department (HOSPITAL_COMMUNITY)
Admission: EM | Admit: 2023-11-26 | Discharge: 2023-11-26 | Disposition: A | Discharge status: Home / Self Care | Source: Ambulatory Visit | Attending: Emergency Medicine | Admitting: Emergency Medicine

## 2023-11-26 ENCOUNTER — Encounter (HOSPITAL_COMMUNITY): Payer: Self-pay

## 2023-11-26 ENCOUNTER — Other Ambulatory Visit: Payer: Self-pay

## 2023-11-26 ENCOUNTER — Emergency Department (HOSPITAL_COMMUNITY)

## 2023-11-26 ENCOUNTER — Ambulatory Visit: Payer: Self-pay

## 2023-11-26 ENCOUNTER — Encounter: Payer: Self-pay | Admitting: Radiology

## 2023-11-26 DIAGNOSIS — F411 Generalized anxiety disorder: Secondary | ICD-10-CM | POA: Diagnosis not present

## 2023-11-26 DIAGNOSIS — F4320 Adjustment disorder, unspecified: Secondary | ICD-10-CM | POA: Diagnosis not present

## 2023-11-26 DIAGNOSIS — H538 Other visual disturbances: Secondary | ICD-10-CM | POA: Insufficient documentation

## 2023-11-26 DIAGNOSIS — R29898 Other symptoms and signs involving the musculoskeletal system: Secondary | ICD-10-CM

## 2023-11-26 DIAGNOSIS — F840 Autistic disorder: Secondary | ICD-10-CM | POA: Insufficient documentation

## 2023-11-26 DIAGNOSIS — R531 Weakness: Secondary | ICD-10-CM | POA: Insufficient documentation

## 2023-11-26 DIAGNOSIS — F8181 Disorder of written expression: Secondary | ICD-10-CM | POA: Diagnosis not present

## 2023-11-26 DIAGNOSIS — F900 Attention-deficit hyperactivity disorder, predominantly inattentive type: Secondary | ICD-10-CM | POA: Diagnosis not present

## 2023-11-26 LAB — CBC WITH DIFFERENTIAL/PLATELET
Abs Immature Granulocytes: 0.03 K/uL (ref 0.00–0.07)
Basophils Absolute: 0 K/uL (ref 0.0–0.1)
Basophils Relative: 0 %
Eosinophils Absolute: 0.1 K/uL (ref 0.0–0.5)
Eosinophils Relative: 2 %
HCT: 41.4 % (ref 39.0–52.0)
Hemoglobin: 14 g/dL (ref 13.0–17.0)
Immature Granulocytes: 1 %
Lymphocytes Relative: 22 %
Lymphs Abs: 1.4 K/uL (ref 0.7–4.0)
MCH: 29.9 pg (ref 26.0–34.0)
MCHC: 33.8 g/dL (ref 30.0–36.0)
MCV: 88.3 fL (ref 80.0–100.0)
Monocytes Absolute: 0.5 K/uL (ref 0.1–1.0)
Monocytes Relative: 8 %
Neutro Abs: 4.5 K/uL (ref 1.7–7.7)
Neutrophils Relative %: 67 %
Platelets: 253 K/uL (ref 150–400)
RBC: 4.69 MIL/uL (ref 4.22–5.81)
RDW: 12.8 % (ref 11.5–15.5)
WBC: 6.6 K/uL (ref 4.0–10.5)
nRBC: 0 % (ref 0.0–0.2)

## 2023-11-26 LAB — BASIC METABOLIC PANEL WITH GFR
Anion gap: 10 (ref 5–15)
BUN: 10 mg/dL (ref 6–20)
CO2: 26 mmol/L (ref 22–32)
Calcium: 9 mg/dL (ref 8.9–10.3)
Chloride: 104 mmol/L (ref 98–111)
Creatinine, Ser: 0.83 mg/dL (ref 0.61–1.24)
GFR, Estimated: >60 mL/min (ref >60)
Glucose, Bld: 92 mg/dL (ref 70–99)
Potassium: 4 mmol/L (ref 3.5–5.1)
Sodium: 141 mmol/L (ref 135–145)

## 2023-11-26 NOTE — Discharge Instructions (Signed)
 Seen in the ER for intermittent blurry vision, your vision is normal at this time with your glasses, blood work was normal, MRI of your brain was also fortunately normal, this was done because you had the blurry vision in conjunction with some reported decree strength in your right hand.  Fortunately your testing was normal but you should follow-up with the eye doctor and with neurology.  Come back for any new or worsening symptoms.

## 2023-11-26 NOTE — Telephone Encounter (Signed)
 FYI Only or Action Required?: FYI only for provider: ED advised.  Patient was last seen in primary care on 06/21/2023 by Terry Wilhelmena Lloyd Hilario, FNP.  Called Nurse Triage reporting Blurred Vision.  Symptoms began several months ago.  Interventions attempted: Rest, hydration, or home remedies.  Symptoms are: gradually worsening.  Triage Disposition: Go to ED Now (Notify PCP)  Patient/caregiver understands and will follow disposition?: Yes  Copied from CRM [id number]. Topic: Clinical - Red Word Triage >> Nov 26, 2023 10:37 AM Rosaria BRAVO wrote: Red Word that prompted transfer to Nurse Triage: Blurred vision, glasses only help but so much, sometimes not useful at all. Right hand losing strength rapidly.    ----------------------------------------------------------------------- From previous Reason for Contact - Scheduling: Patient/patient representative is calling to schedule an appointment. Refer to attachments for appointment information. Reason for Disposition  [1] Weakness (i.e., paralysis, loss of muscle strength) of the face, arm / hand, or leg / foot on one side of the body AND [2] sudden onset AND [3] brief (now gone)  Answer Assessment - Initial Assessment Questions Right hand strength intermittent- trying to type with the stylus on his phone and ended up dropping. Does have muscle spasms and motor tics- arm jerking. Unsure if that is getting worse. Last episode about an hour ago  This year it started- a few seconds or more each time. Denies paresthesias or decreased sensation.   Blurred vision- off and on for several months- daily episodes-  couple minutes to 5-6 minutes long. He wears glasses and the glasses do not help at all during the episodes. Is having intermittent dizzy/vertigo spells as well, sometimes correlating with blurry vision. No hx of autoimmune, clotting disorders, and carotid dissection that he is aware of. His father was on warfarin but unsure why.   Advised  that he needs to be evaluated in the emergency room. Concern for TIA or other neurological issues. Patient will head there now. Will call back to schedule HFU  1. SYMPTOM: What is the main symptom you are concerned about? (e.g., weakness, numbness)     Intermittent blurred vision  2. ONSET: When did this start? (e.g., minutes, hours, days; while sleeping)     Intermittent episodes for months 3. LAST NORMAL: When was the last time you (the patient) were normal (no symptoms)?     currently 4. PATTERN Does this come and go, or has it been constant since it started?  Is it present now?     Comes and goes 5. CARDIAC SYMPTOMS: Have you had any of the following symptoms: chest pain, difficulty breathing, palpitations?     denies 6. NEUROLOGIC SYMPTOMS: Have you had any of the following symptoms: headache, dizziness, vision loss, double vision, changes in speech, unsteady on your feet?     Blurred vision intermittently, right hand intermittently dropping things, vertigo spells 7. OTHER SYMPTOMS: Do you have any other symptoms?     Denies double vision, incoordination, weakness in right leg or arm (only right hand). Speech is clear with no confusion  Protocols used: Neurologic Deficit-A-AH

## 2023-11-26 NOTE — Telephone Encounter (Signed)
 Noted , ED advised

## 2023-11-26 NOTE — ED Provider Notes (Signed)
 [place] EMERGENCY DEPARTMENT AT [facility] Provider Note   CSN: [id number] Arrival date & time: 11/26/23  1109     Patient presents with: Blurred Vision   Henry [name] is a 23 y.o. male.  History of autism, ADHD, anxiety and depression.  Presents to the ER today for evaluation of 4 months of intermittent blurry vision that is noted to be binocular and right hand weakness.  He has noticed he started dropping things with his right hand.  Regarding the blurry vision, he states it only happens occasionally but when it does he feels like he notices a spinning sensation.  His glasses are less than a year old.  He denies any eye pain with this, no spots in his vision.  No vision loss.  He tried to make an appoint with his eye doctor, but they canceled the appointment because they no longer accept his insurance.  He has not any fever or chills, no nausea or vomiting, no chest pain or shortness of breath.  Denies any numbness or tingling.   HPI     Prior to Admission medications   Medication Sig Start Date End Date Taking? Authorizing Provider  lamoTRIgine  (LAMICTAL ) 100 MG tablet Take 1 tablet (100 mg total) by mouth at bedtime. Patient not taking: Reported on 08/14/2023 02/08/23   Onita Duos, MD  lamoTRIgine  (LAMICTAL ) 25 MG tablet One at bedtime xone week 2 tabs at bedtime xone week 3 tabs at bedtime xone week Patient not taking: Reported on 08/14/2023 02/08/23   Onita Duos, MD  ondansetron  (ZOFRAN -ODT) 4 MG disintegrating tablet Take 1 tablet (4 mg total) by mouth every 8 (eight) hours as needed for nausea or vomiting. 02/08/23   Onita Duos, MD  ondansetron  (ZOFRAN -ODT) 4 MG disintegrating tablet Take 1 tablet (4 mg total) by mouth every 8 (eight) hours as needed for nausea or vomiting. 09/22/23   Horton, Charmaine FALCON, MD  propranolol  (INDERAL ) 40 MG tablet Take 1 tablet (40 mg total) by mouth every 8 (eight) hours as needed. 08/14/23   Onita Duos, MD  rizatriptan  (MAXALT -MLT) 10 MG  disintegrating tablet Take 1 tablet (10 mg total) by mouth as needed. May repeat in 2 hours if needed 08/14/23   Onita Duos, MD  tiZANidine  (ZANAFLEX ) 4 MG tablet Take 1 tablet (4 mg total) by mouth every 6 (six) hours as needed for muscle spasms. 02/08/23   Onita Duos, MD    Allergies: Patient has no known allergies.    Review of Systems  Updated Vital Signs BP 128/80   Pulse 94   Temp 98.4 F (36.9 C) (Oral)   Resp 20   Ht 5' 7 (1.702 m)   Wt (!) 149.7 kg   SpO2 96%   BMI 51.69 kg/m   Physical Exam Vitals and nursing note reviewed.  Constitutional:      General: He is not in acute distress.    Appearance: He is well-developed.  HENT:     Head: Normocephalic and atraumatic.     Mouth/Throat:     Mouth: Mucous membranes are moist.  Eyes:     Extraocular Movements: Extraocular movements intact.     Conjunctiva/sclera: Conjunctivae normal.     Pupils: Pupils are equal, round, and reactive to light.  Cardiovascular:     Rate and Rhythm: Normal rate and regular rhythm.     Heart sounds: No murmur heard. Pulmonary:     Effort: Pulmonary effort is normal. No respiratory distress.     Breath  sounds: Normal breath sounds.  Abdominal:     Palpations: Abdomen is soft.     Tenderness: There is no abdominal tenderness.  Musculoskeletal:        General: No swelling.     Cervical back: Normal range of motion and neck supple. No rigidity or tenderness.  Skin:    General: Skin is warm and dry.     Capillary Refill: Capillary refill takes less than 2 seconds.  Neurological:     General: No focal deficit present.     Mental Status: He is alert and oriented to person, place, and time.     Cranial Nerves: No cranial nerve deficit.     Sensory: No sensory deficit.     Motor: No weakness.     Coordination: Coordination normal.     Gait: Gait normal.  Psychiatric:        Mood and Affect: Mood normal.     (all labs ordered are listed, but only abnormal results are displayed) Labs  Reviewed - No data to display  EKG: None  Radiology: No results found.   Procedures   Medications Ordered in the ED - No data to display                                  Medical Decision Making This patient presents to the ED for concern of blurry vision and right hand weakness x 4 months, this involves an extensive number of treatment options, and is a complaint that carries with it a high risk of complications and morbidity.  The differential diagnosis includes intracranial lesion, MS, CVA, other   Additional history obtained:  Additional history obtained from EMR External records from outside source obtained and reviewed including prior notes, labs   Lab Tests:  I Ordered, and personally interpreted labs.  The pertinent results include: CBC and BMP are normal   Imaging Studies ordered:  I ordered imaging studies including MRI brain  I independently visualized and interpreted imaging which showed no acute abnormalities I agree with the radiologist interpretation     Problem List / ED Course / Critical interventions / Medication management  Pt is having binocular blurry vision intermittently, with intermittent right hand weakness and dropping objects on right hand for the past 4 months.  Patient's vision here is normal with his corrective lenses he has a normal neurologic exam I do not notice any weakness in his extremities he is able to ambulate without difficulty.  MRI brain ordered to rule out acute abnormality and this was normal.  Reassured patient there are no masses or lesions to explain findings and he is to follow-up closely with eye doctor and neurology, referral placed for neurology.  Advised on strict return precautions.  I have reviewed the patients home medicines and have made adjustments as needed      Amount and/or Complexity of Data Reviewed Labs: ordered. Radiology: ordered.        Final diagnoses:  None    ED Discharge Orders      None          Suellen Sherran LABOR, PA-C 11/26/23 1731    Elnor Jayson LABOR, DO 11/29/23 [phone]

## 2023-11-26 NOTE — ED Triage Notes (Signed)
 Pt arrived via POV c/o blurry vision and right hand weakness X 4 months. Pt reports having insurance problems with his eye doctor and was unable to be seen and Pt reports his PCP advised him to come to the ED for evaluation. Pt denies any recent injuries.

## 2023-12-25 ENCOUNTER — Ambulatory Visit: Payer: MEDICAID

## 2023-12-25 VITALS — BP 105/67 | HR 72 | Ht 67.0 in | Wt 341.1 lb

## 2023-12-25 DIAGNOSIS — E785 Hyperlipidemia, unspecified: Secondary | ICD-10-CM | POA: Diagnosis not present

## 2023-12-25 DIAGNOSIS — R7989 Other specified abnormal findings of blood chemistry: Secondary | ICD-10-CM

## 2023-12-25 NOTE — Progress Notes (Signed)
 Established Patient Office Visit  Subjective   Patient ID: Henry [name], male    DOB: 2000-12-30  Age: 23 y.o. MRN: [id number]  Chief Complaint  Patient presents with   Medical Management of Chronic Issues    Pt here for 6 month follow up     HPI Discussed the use of AI scribe software for clinical note transcription with the patient, who gave verbal consent to proceed.  History of Present Illness    Henry [name] is a 23 year old male who presents with blurred vision and right hand weakness.  Visual disturbance - Blurred vision present, intermittent in nature - No specific triggers identified - Last eye exam approximately 6-7 months ago at a facility in [place] - Dissatisfaction with previous glasses prescription - Seeking optometry follow-up for reassessment and updated prescription  Right hand weakness - Intermittent loss of strength in right hand - Difficulty holding objects, resulting in dropping them - No associated numbness or tingling - MRI performed on November 26, 2023 showed no abnormalities  Right finger trauma - Smashed finger while putting up an antenna - Resulted in bleeding, managed with a Band-Aid - Grandmother applied oil to the injury  Medication history - No longer taking Lamictal      Patient Active Problem List   Diagnosis Date Noted   Fatigue 06/08/2023   Skin tag 04/27/2023   Chronic migraine w/o aura w/o status migrainosus, not intractable 02/08/2023   Migraine 08/17/2022   Tremors of nervous system 05/31/2022   Autism spectrum disorder 09/01/2021   Plantar fasciitis of left foot 05/05/2021   Positive self-administered antigen test for COVID-19 12/08/2020   Acute upper back pain 05/26/2020   ADHD (attention deficit hyperactivity disorder), combined type 02/03/2020   Pain in both knees 12/09/2019   SIRS (systemic inflammatory response syndrome) (HCC) 05/11/2019   Generalized anxiety disorder 08/26/2018   Tremor of right hand 12/10/2017    Seizure-like activity (HCC) 11/26/2017   Depression, recurrent 03/21/2017   Class 3 severe obesity due to excess calories without serious comorbidity with body mass index (BMI) of 45.0 to 49.9 in adult (HCC) 08/29/2016   Tics of organic origin 03/22/2015   Morbid obesity with body mass index of 50.0-59.9 in adult [facility]) 03/22/2015   Anxiety and depression 02/24/2015   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  Constitutional:      Appearance: Normal appearance. He is obese.  HENT:     Head: Normocephalic.     Right Ear: Tympanic membrane, ear canal and external ear normal.     Left Ear: Tympanic membrane, ear canal and external ear normal.     Nose: Nose normal.     Mouth/Throat:     Mouth: Mucous membranes are moist.     Pharynx: Oropharynx is clear.  Eyes:  Extraocular Movements: Extraocular movements intact.     Pupils: Pupils are equal, round, and reactive to light.  Cardiovascular:     Rate and Rhythm: Normal rate and regular rhythm.  Pulmonary:     Effort: Pulmonary effort is normal.     Breath sounds: Normal breath sounds.  Musculoskeletal:     Cervical back: Normal range of motion and neck supple.  Skin:    General: Skin is warm and dry.  Neurological:     Mental Status: He is alert and oriented to person, place, and time.  Psychiatric:        Mood and Affect: Mood normal.        Thought Content: Thought content normal.      No results found for  any visits on 12/25/23.    The ASCVD Risk score (Arnett DK, et al., 2019) failed to calculate for the following reasons:   The 2019 ASCVD risk score is only valid for ages 46 to 82    Assessment & Plan:   Problem List Items Addressed This Visit       Other   Elevated TSH - Primary   Thyroid  levels low, re-evaluation needed. - Ordered thyroid  function tests.       Relevant Orders   TSH + free T4   Thyroid  Stimulating Immunoglobulin   Thyroid  peroxidase antibody   RESOLVED: Dyslipidemia    Return in about 6 months (around 06/24/2024) for chronic follow-up with PCP.    Leita Longs, FNP

## 2023-12-25 NOTE — Assessment & Plan Note (Signed)
 Thyroid  levels low, re-evaluation needed. - Ordered thyroid  function tests.

## 2024-01-14 ENCOUNTER — Telehealth: Payer: Self-pay

## 2024-01-14 NOTE — Telephone Encounter (Signed)
 Copied from CRM #[id number]. Topic: Referral - Question >> Jan 14, 2024  1:45 PM Sophia H wrote: Reason for CRM: Patient is requesting a referral from PCP to ophthalmology. States he has seen PCP for this already and vision at night is worse now than before, if appt is needed again please advise.  [facility] [place]  FAX # [phone]

## 2024-01-15 ENCOUNTER — Other Ambulatory Visit: Payer: Self-pay

## 2024-01-15 DIAGNOSIS — H539 Unspecified visual disturbance: Secondary | ICD-10-CM

## 2024-01-15 NOTE — Telephone Encounter (Signed)
 Referral placed.

## 2024-02-06 ENCOUNTER — Telehealth: Payer: Self-pay | Admitting: Neurology

## 2024-02-06 NOTE — Telephone Encounter (Signed)
 Pt grandmother called to cancel appt due to Pt not needing appt  Appt Canceled

## 2024-02-07 ENCOUNTER — Institutional Professional Consult (permissible substitution): Admitting: Neurology

## 2024-06-24 ENCOUNTER — Ambulatory Visit
